# Patient Record
Sex: Female | Born: 1955 | Hispanic: No | Smoking: Former smoker
Health system: Southern US, Community
[De-identification: ages and names within clinical notes are randomized; demographics above are authoritative.]

## PROBLEM LIST (undated history)

## (undated) DIAGNOSIS — Z6836 Body mass index (BMI) 36.0-36.9, adult: Secondary | ICD-10-CM

## (undated) DIAGNOSIS — M2550 Pain in unspecified joint: Secondary | ICD-10-CM

## (undated) DIAGNOSIS — I10 Essential (primary) hypertension: Secondary | ICD-10-CM

## (undated) DIAGNOSIS — J45909 Unspecified asthma, uncomplicated: Secondary | ICD-10-CM

## (undated) HISTORY — PX: APPENDECTOMY: SHX54

## (undated) HISTORY — DX: Unspecified asthma, uncomplicated: J45.909

## (undated) HISTORY — DX: Essential (primary) hypertension: I10

## (undated) HISTORY — DX: Pain in unspecified joint: M25.50

## (undated) HISTORY — DX: Body mass index (BMI) 36.0-36.9, adult: Z68.36

---

## 2009-09-27 ENCOUNTER — Other Ambulatory Visit: Admission: RE | Admit: 2009-09-27 | Discharge: 2009-09-27 | Payer: Self-pay | Admitting: Family Medicine

## 2010-03-27 ENCOUNTER — Emergency Department (HOSPITAL_COMMUNITY): Admission: EM | Admit: 2010-03-27 | Discharge: 2010-03-27 | Payer: Self-pay | Admitting: Emergency Medicine

## 2010-04-05 ENCOUNTER — Ambulatory Visit: Payer: Self-pay | Admitting: Family Medicine

## 2010-04-05 LAB — CONVERTED CEMR LAB
AST: 26 units/L (ref 0–37)
Albumin: 4 g/dL (ref 3.5–5.2)
Alkaline Phosphatase: 76 units/L (ref 39–117)
BUN: 25 mg/dL — ABNORMAL HIGH (ref 6–23)
CO2: 22 meq/L (ref 19–32)
Calcium: 9.1 mg/dL (ref 8.4–10.5)
Chloride: 107 meq/L (ref 96–112)
Glucose, Bld: 81 mg/dL (ref 70–99)
Potassium: 4.3 meq/L (ref 3.5–5.3)
TSH: 2.56 microintl units/mL (ref 0.350–4.500)
Total Bilirubin: 0.4 mg/dL (ref 0.3–1.2)

## 2010-04-11 ENCOUNTER — Encounter (INDEPENDENT_AMBULATORY_CARE_PROVIDER_SITE_OTHER): Payer: Self-pay | Admitting: Family Medicine

## 2010-04-11 LAB — CONVERTED CEMR LAB
HCV Ab: NEGATIVE
Hep B Core Total Ab: NEGATIVE
Hep B S Ab: NEGATIVE

## 2010-04-19 ENCOUNTER — Ambulatory Visit (HOSPITAL_COMMUNITY): Admission: RE | Admit: 2010-04-19 | Discharge: 2010-04-19 | Payer: Self-pay | Admitting: Family Medicine

## 2010-04-27 ENCOUNTER — Ambulatory Visit: Payer: Self-pay | Admitting: Internal Medicine

## 2010-06-06 ENCOUNTER — Encounter (INDEPENDENT_AMBULATORY_CARE_PROVIDER_SITE_OTHER): Payer: Self-pay | Admitting: Family Medicine

## 2010-06-06 ENCOUNTER — Ambulatory Visit: Payer: Self-pay | Admitting: Internal Medicine

## 2010-06-06 LAB — CONVERTED CEMR LAB
Basophils Absolute: 0 10*3/uL (ref 0.0–0.1)
Eosinophils Relative: 2 % (ref 0–5)
HCT: 41.7 % (ref 36.0–46.0)
Lymphocytes Relative: 26 % (ref 12–46)
Lymphs Abs: 2.9 10*3/uL (ref 0.7–4.0)
MCHC: 31.7 g/dL (ref 30.0–36.0)
MCV: 95.4 fL (ref 78.0–100.0)
Neutrophils Relative %: 66 % (ref 43–77)
Prolactin: 14.6 ng/mL
WBC: 11 10*3/uL — ABNORMAL HIGH (ref 4.0–10.5)

## 2010-06-09 ENCOUNTER — Ambulatory Visit (HOSPITAL_COMMUNITY): Admission: RE | Admit: 2010-06-09 | Discharge: 2010-06-09 | Payer: Self-pay | Admitting: Internal Medicine

## 2010-07-25 ENCOUNTER — Ambulatory Visit (HOSPITAL_COMMUNITY): Admission: RE | Admit: 2010-07-25 | Discharge: 2010-07-25 | Payer: Self-pay | Admitting: Internal Medicine

## 2010-09-08 ENCOUNTER — Ambulatory Visit: Payer: Self-pay | Admitting: Obstetrics and Gynecology

## 2010-09-08 ENCOUNTER — Other Ambulatory Visit
Admission: RE | Admit: 2010-09-08 | Discharge: 2010-09-08 | Payer: Self-pay | Source: Home / Self Care | Admitting: Obstetrics and Gynecology

## 2010-09-08 LAB — CONVERTED CEMR LAB: LH: 24.2 milliintl units/mL

## 2010-10-14 ENCOUNTER — Ambulatory Visit (HOSPITAL_COMMUNITY): Admission: RE | Admit: 2010-10-14 | Payer: Self-pay | Source: Home / Self Care | Admitting: Family Medicine

## 2010-11-20 ENCOUNTER — Encounter: Payer: Self-pay | Admitting: *Deleted

## 2011-01-16 LAB — POCT I-STAT, CHEM 8
Calcium, Ion: 1.14 mmol/L (ref 1.12–1.32)
Glucose, Bld: 91 mg/dL (ref 70–99)
HCT: 38 % (ref 36.0–46.0)
Hemoglobin: 12.9 g/dL (ref 12.0–15.0)

## 2011-07-30 ENCOUNTER — Other Ambulatory Visit: Payer: Self-pay | Admitting: Obstetrics and Gynecology

## 2011-07-30 DIAGNOSIS — Z1231 Encounter for screening mammogram for malignant neoplasm of breast: Secondary | ICD-10-CM

## 2011-08-01 ENCOUNTER — Ambulatory Visit (INDEPENDENT_AMBULATORY_CARE_PROVIDER_SITE_OTHER): Payer: Self-pay | Admitting: *Deleted

## 2011-08-01 ENCOUNTER — Ambulatory Visit (HOSPITAL_COMMUNITY): Payer: Self-pay | Attending: Obstetrics and Gynecology

## 2011-08-01 ENCOUNTER — Inpatient Hospital Stay (INDEPENDENT_AMBULATORY_CARE_PROVIDER_SITE_OTHER)
Admission: RE | Admit: 2011-08-01 | Discharge: 2011-08-01 | Disposition: A | Discharge status: Home / Self Care | Payer: Self-pay | Source: Ambulatory Visit | Attending: Family Medicine | Admitting: Family Medicine

## 2011-08-01 ENCOUNTER — Encounter: Payer: Self-pay | Admitting: *Deleted

## 2011-08-01 VITALS — BP 138/100 | HR 106 | Temp 97.1°F | Resp 18

## 2011-08-01 DIAGNOSIS — R51 Headache: Secondary | ICD-10-CM

## 2011-08-01 DIAGNOSIS — I10 Essential (primary) hypertension: Secondary | ICD-10-CM

## 2011-08-01 LAB — POCT I-STAT, CHEM 8
BUN: 27 mg/dL — ABNORMAL HIGH (ref 6–23)
Calcium, Ion: 1.28 mmol/L (ref 1.12–1.32)
HCT: 44 % (ref 36.0–46.0)
TCO2: 24 mmol/L (ref 0–100)

## 2011-08-01 NOTE — Progress Notes (Signed)
Pt in for [facility]CP clinic.  When called to back, pt states "I don't feel good, my blood pressure is up."  Complains of lightheadedness and blurred vision x 4 days.  Also states that she has been 'peeing' a lot since last night.  Pt states that she was on blood pressure meds but was taken off of them 5 months ago by her doctor.  She is currently on no medication (per patient).  Pt is alert&ox3, skin is warm and dry, denies pain. States "I am just blurry".   After discussion with BCCCP team, we will call family to pick patient up and drive her to an urgent care facility.  [facility], [facility] or [facility]).  Patient and interpreter verbalizes understanding of today's issues.  Patient will be rescheduled for the [facility] clinic on a future date.

## 2011-08-15 ENCOUNTER — Ambulatory Visit (INDEPENDENT_AMBULATORY_CARE_PROVIDER_SITE_OTHER): Payer: Self-pay | Admitting: *Deleted

## 2011-08-15 ENCOUNTER — Encounter (HOSPITAL_COMMUNITY): Payer: Self-pay

## 2011-08-15 ENCOUNTER — Ambulatory Visit (HOSPITAL_COMMUNITY)
Admission: RE | Admit: 2011-08-15 | Discharge: 2011-08-15 | Disposition: A | Discharge status: Home / Self Care | Payer: Self-pay | Source: Ambulatory Visit | Attending: Obstetrics and Gynecology | Admitting: Obstetrics and Gynecology

## 2011-08-15 VITALS — BP 138/87 | HR 94 | Temp 98.0°F | Resp 18 | Ht 60.0 in | Wt 184.8 lb

## 2011-08-15 DIAGNOSIS — Z01419 Encounter for gynecological examination (general) (routine) without abnormal findings: Secondary | ICD-10-CM

## 2011-08-15 DIAGNOSIS — Z1231 Encounter for screening mammogram for malignant neoplasm of breast: Secondary | ICD-10-CM

## 2011-08-15 NOTE — Patient Instructions (Signed)
Taught patient how to do BSE and gave patient education materials. Patient sent upstairs with escort to get mammogram. Told patient if Pap smear is normal will not need a Pap smear for 3 years per BCCCP guidelines. Patient verbalized understanding.

## 2011-08-15 NOTE — Progress Notes (Signed)
No complaints today.  Pap Smear:    Pap smear completed today. Per patient was not sure of last Pap smear. In [facility] patient had a Pap smear 09/08/2010. Pap smear result was normal. Told patient if this Pap smear is normal will not need another Pap smear for 3 years per BCCCP guidelines.  Physical exam: Breasts      Breasts symmetrical. No skin abnormalities. No nipple retraction and no nipple discharge bilateral breasts. No lymphadenopathy. No lumps bilateral breasts.      Pelvic/Bimanual   Ext Genitalia      No lesions and no swelling. No discharge observed.    Vagina      Vagina pink. Vagina of normal texture and no lesions observed. Bloody discharge observed in vaginal area.     Cervix      Cervix present. Cervix friable and red around os. Cervix normal texture.     Uterus      Uterus present and palpable. Uterus normal position and normal size.     Adnexae      Ovaries present and not palpable. No tenderness on palpation.         Rectovaginal      No rectal exam completed today. No abnormalities observed. No complaints of rectal pain or any concerns.

## 2011-08-16 ENCOUNTER — Other Ambulatory Visit: Payer: Self-pay | Admitting: Obstetrics and Gynecology

## 2011-08-16 DIAGNOSIS — R928 Other abnormal and inconclusive findings on diagnostic imaging of breast: Secondary | ICD-10-CM

## 2011-09-06 ENCOUNTER — Ambulatory Visit
Admission: RE | Admit: 2011-09-06 | Discharge: 2011-09-06 | Disposition: A | Discharge status: Home / Self Care | Payer: No Typology Code available for payment source | Source: Ambulatory Visit | Attending: Obstetrics and Gynecology | Admitting: Obstetrics and Gynecology

## 2011-09-06 DIAGNOSIS — R928 Other abnormal and inconclusive findings on diagnostic imaging of breast: Secondary | ICD-10-CM

## 2011-09-08 ENCOUNTER — Telehealth: Payer: Self-pay | Admitting: *Deleted

## 2011-09-08 NOTE — Telephone Encounter (Signed)
Spoke with patient using language line interpreter # [phone] Mary Hubbard. Pt notified of normal pap smear, and that diagnostic mammogram was normal. Pt advised that she needs to have yearly screenings done. Pt voiced understanding and will follow up as planned.

## 2011-09-12 ENCOUNTER — Encounter: Payer: Self-pay | Admitting: Obstetrics and Gynecology

## 2014-03-04 ENCOUNTER — Ambulatory Visit: Payer: Self-pay

## 2014-04-20 ENCOUNTER — Ambulatory Visit: Payer: No Typology Code available for payment source | Attending: Internal Medicine | Admitting: Internal Medicine

## 2014-04-20 ENCOUNTER — Encounter: Payer: Self-pay | Admitting: Internal Medicine

## 2014-04-20 VITALS — BP 152/99 | HR 84 | Temp 97.9°F | Resp 18 | Ht 62.5 in | Wt 195.8 lb

## 2014-04-20 DIAGNOSIS — Z0000 Encounter for general adult medical examination without abnormal findings: Secondary | ICD-10-CM

## 2014-04-20 DIAGNOSIS — J45909 Unspecified asthma, uncomplicated: Secondary | ICD-10-CM

## 2014-04-20 DIAGNOSIS — I10 Essential (primary) hypertension: Secondary | ICD-10-CM

## 2014-04-20 LAB — POCT GLYCOSYLATED HEMOGLOBIN (HGB A1C): HEMOGLOBIN A1C: 4.9

## 2014-04-20 MED ORDER — ALBUTEROL SULFATE HFA 108 (90 BASE) MCG/ACT IN AERS: 2.0000 | INHALATION_SPRAY | Freq: Four times a day (QID) | RESPIRATORY_TRACT | Status: DC | PRN

## 2014-04-20 MED ORDER — ALBUTEROL SULFATE (2.5 MG/3ML) 0.083% IN NEBU: 2.5000 mg | INHALATION_SOLUTION | Freq: Four times a day (QID) | RESPIRATORY_TRACT | Status: DC | PRN

## 2014-04-20 MED ORDER — AMLODIPINE BESYLATE 2.5 MG PO TABS: 2.5000 mg | ORAL_TABLET | Freq: Every day | ORAL | Status: DC

## 2014-04-20 MED ORDER — PREDNISONE 50 MG PO TABS: 10.0000 mg | ORAL_TABLET | Freq: Every day | ORAL | Status: DC

## 2014-04-20 NOTE — Addendum Note (Signed)
Addended by: Robbie Lis on: 04/20/2014 09:32 AM   Modules accepted: Orders

## 2014-04-20 NOTE — Patient Instructions (Signed)
Hypertension Hypertension, commonly called high blood pressure, is when the force of blood pumping through your arteries is too strong. Your arteries are the blood vessels that carry blood from your heart throughout your body. A blood pressure reading consists of a higher number over a lower number, such as 110/72. The higher number (systolic) is the pressure inside your arteries when your heart pumps. The lower number (diastolic) is the pressure inside your arteries when your heart relaxes. Ideally you want your blood pressure below 120/80. Hypertension forces your heart to work harder to pump blood. Your arteries may become narrow or stiff. Having hypertension puts you at risk for heart disease, stroke, and other problems.  RISK FACTORS Some risk factors for high blood pressure are controllable. Others are not.  Risk factors you cannot control include:   Race. You may be at higher risk if you are African American.  Age. Risk increases with age.  Gender. Men are at higher risk than women before age 45 years. After age 65, women are at higher risk than men. Risk factors you can control include:  Not getting enough exercise or physical activity.  Being overweight.  Getting too much fat, sugar, calories, or salt in your diet.  Drinking too much alcohol. SIGNS AND SYMPTOMS Hypertension does not usually cause signs or symptoms. Extremely high blood pressure (hypertensive crisis) may cause headache, anxiety, shortness of breath, and nosebleed. DIAGNOSIS  To check if you have hypertension, your health care provider will measure your blood pressure while you are seated, with your arm held at the level of your heart. It should be measured at least twice using the same arm. Certain conditions can cause a difference in blood pressure between your right and left arms. A blood pressure reading that is higher than normal on one occasion does not mean that you need treatment. If one blood pressure reading  is high, ask your health care provider about having it checked again. TREATMENT  Treating high blood pressure includes making lifestyle changes and possibly taking medication. Living a healthy lifestyle can help lower high blood pressure. You may need to change some of your habits. Lifestyle changes may include:  Following the DASH diet. This diet is high in fruits, vegetables, and whole grains. It is low in salt, red meat, and added sugars.  Getting at least 2 1/2 hours of brisk physical activity every week.  Losing weight if necessary.  Not smoking.  Limiting alcoholic beverages.  Learning ways to reduce stress. If lifestyle changes are not enough to get your blood pressure under control, your health care provider may prescribe medicine. You may need to take more than one. Work closely with your health care provider to understand the risks and benefits. HOME CARE INSTRUCTIONS  Have your blood pressure rechecked as directed by your health care provider.   Only take medicine as directed by your health care provider. Follow the directions carefully. Blood pressure medicines must be taken as prescribed. The medicine does not work as well when you skip doses. Skipping doses also puts you at risk for problems.   Do not smoke.   Monitor your blood pressure at home as directed by your health care provider. SEEK MEDICAL CARE IF:   You think you are having a reaction to medicines taken.  You have recurrent headaches or feel dizzy.  You have swelling in your ankles.  You have trouble with your vision. SEEK IMMEDIATE MEDICAL CARE IF:  You develop a severe headache or   confusion.  You have unusual weakness, numbness, or feel faint.  You have severe chest or abdominal pain.  You vomit repeatedly.  You have trouble breathing. MAKE SURE YOU:   Understand these instructions.  Will watch your condition.  Will get help right away if you are not doing well or get  worse. Document Released: 10/16/2005 Document Revised: 10/21/2013 Document Reviewed: 08/08/2013 ExitCare Patient Information 2015 [employer]. This information is not intended to replace advice given to you by your health care provider. Make sure you discuss any questions you have with your health care provider.  

## 2014-04-20 NOTE — Progress Notes (Signed)
Patient ID: Mary Hubbard, female   DOB: [birth year], 58 y.o.   MRN: [id number]  CC: New patient  HPI: 58 year old female with past medical history of asthma and hypertension but is not on any blood pressure medication presented to clinic to establish primary care physician. Patient reports feeling short of breath for past one to 2 weeks with some wheezing. She does have cough although inconsistent dry and sometimes productive. No fevers or chills. No chest tightness. No blurry vision or headaches.  No Known Allergies Past Medical History  Diagnosis Date  . Hypertension   . Joint pain   . Asthma    No current outpatient prescriptions on file prior to visit.   No current facility-administered medications on file prior to visit.   No family history on file. History   Social History  . Marital Status: Widowed    Spouse Name: N/A    Number of Children: N/A  . Years of Education: N/A   Occupational History  . unemployed [employer] History Main Topics  . Smoking status: Never Smoker   . Smokeless tobacco: Never Used  . Alcohol Use: No  . Drug Use: No  . Sexual Activity: No   Other Topics Concern  . Not on file   Social History Narrative  . No narrative on file    Review of Systems  Constitutional: Negative for fever, chills, diaphoresis, activity change, appetite change and fatigue.  HENT: Negative for ear pain, nosebleeds, congestion, facial swelling, rhinorrhea, neck pain, neck stiffness and ear discharge.   Eyes: Negative for pain, discharge, redness, itching and visual disturbance.  Respiratory: Negative for cough, choking, chest tightness, shortness of breath, wheezing and stridor.   Cardiovascular: Negative for chest pain, palpitations and leg swelling.  Gastrointestinal: Negative for abdominal distention.  Genitourinary: Negative for dysuria, urgency, frequency, hematuria, flank pain, decreased urine volume, difficulty urinating and dyspareunia.  Musculoskeletal:  Negative for back pain, joint swelling, arthralgias and gait problem.  Neurological: Negative for dizziness, tremors, seizures, syncope, facial asymmetry, speech difficulty, weakness, light-headedness, numbness and headaches.  Hematological: Negative for adenopathy. Does not bruise/bleed easily.  Psychiatric/Behavioral: Negative for hallucinations, behavioral problems, confusion, dysphoric mood, decreased concentration and agitation.    Objective:   Filed Vitals:   04/20/14 0913  BP: 152/99  Pulse: 84  Temp: 97.9 F (36.6 C)  Resp: 18    Physical Exam  Constitutional: Appears well-developed and well-nourished. No distress.  HENT: Normocephalic. External right and left ear normal. Oropharynx is clear and moist.  Eyes: Conjunctivae and EOM are normal. PERRLA, no scleral icterus.  Neck: Normal ROM. Neck supple. No JVD. No tracheal deviation. No thyromegaly.  CVS: RRR, S1/S2 +, no murmurs, no gallops, no carotid bruit.  Pulmonary: Wheezing in upper and mid lung lobes, no crackles.  Abdominal: Soft. BS +,  no distension, tenderness, rebound or guarding.  Musculoskeletal: Normal range of motion. No edema and no tenderness.  Lymphadenopathy: No lymphadenopathy noted, cervical, inguinal. Neuro: Alert. Normal reflexes, muscle tone coordination. No cranial nerve deficit. Skin: Skin is warm and dry. No rash noted. Not diaphoretic. No erythema. No pallor.  Psychiatric: Normal mood and affect. Behavior, judgment, thought content normal.   Lab Results  Component Value Date   WBC 11.0* 06/06/2010   HGB 15.0 08/01/2011   HCT 44.0 08/01/2011   MCV 95.4 06/06/2010   PLT 209 06/06/2010   Lab Results  Component Value Date   CREATININE 0.80 08/01/2011   BUN 27* 08/01/2011   NA  138 08/01/2011   K 4.7 08/01/2011   CL 105 08/01/2011   CO2 22 04/05/2010    No results found for this basename: HGBA1C   Lipid Panel  No results found for this basename: chol, trig, hdl, cholhdl, vldl, ldlcalc        Assessment and plan:   Patient Active Problem List   Diagnosis Date Noted  . Asthma due to seasonal allergies 04/20/2014    Priority: Medium - Prescription provided for albuterol inhaler as needed. Patient will take prednisone 50 mg for 7 days daily. She was instructed to come back to see [place] in one week after she finishes prednisone course to make sure her respiratory status remains stable . - Referral to pulmonary provided   . HTN (hypertension) 04/20/2014    Priority: Medium - Start low-dose Norvasc 2.5 mg daily. Recheck blood pressure during next visit in one week  - We have discussed target BP range - I have advised pt to check BP regularly and to call us back if the numbers are higher than 140/90 - discussed the importance of compliance with medical therapy and diet   . Preventative health care 04/20/2014    Priority: Medium - Check A1c

## 2014-04-20 NOTE — Addendum Note (Signed)
Addended by: Robbie Lis on: 04/20/2014 09:37 AM   Modules accepted: Orders, Medications

## 2014-04-20 NOTE — Progress Notes (Signed)
Patient presents to establish care for Asthma and HTN CC: dizziness and cough

## 2014-04-29 ENCOUNTER — Encounter: Payer: Self-pay | Admitting: Internal Medicine

## 2014-04-29 ENCOUNTER — Ambulatory Visit: Payer: No Typology Code available for payment source | Attending: Internal Medicine | Admitting: Internal Medicine

## 2014-04-29 ENCOUNTER — Other Ambulatory Visit: Payer: Self-pay | Admitting: Internal Medicine

## 2014-04-29 ENCOUNTER — Ambulatory Visit: Payer: No Typology Code available for payment source | Attending: Internal Medicine

## 2014-04-29 VITALS — BP 130/87 | HR 70 | Temp 98.0°F | Resp 14 | Ht 62.0 in | Wt 195.0 lb

## 2014-04-29 DIAGNOSIS — M2550 Pain in unspecified joint: Secondary | ICD-10-CM | POA: Insufficient documentation

## 2014-04-29 DIAGNOSIS — J45909 Unspecified asthma, uncomplicated: Secondary | ICD-10-CM | POA: Insufficient documentation

## 2014-04-29 DIAGNOSIS — I10 Essential (primary) hypertension: Secondary | ICD-10-CM | POA: Insufficient documentation

## 2014-04-29 MED ORDER — ALBUTEROL SULFATE HFA 108 (90 BASE) MCG/ACT IN AERS: 2.0000 | INHALATION_SPRAY | Freq: Four times a day (QID) | RESPIRATORY_TRACT | Status: DC | PRN

## 2014-04-29 NOTE — Patient Instructions (Signed)
DASH Eating Plan  DASH stands for "Dietary Approaches to Stop Hypertension." The DASH eating plan is a healthy eating plan that has been shown to reduce high blood pressure (hypertension). Additional health benefits may include reducing the risk of type 2 diabetes mellitus, heart disease, and stroke. The DASH eating plan may also help with weight loss.  WHAT DO I NEED TO KNOW ABOUT THE DASH EATING PLAN?  For the DASH eating plan, you will follow these general guidelines:  · Choose foods with a percent daily value for sodium of less than 5% (as listed on the food label).  · Use salt-free seasonings or herbs instead of table salt or sea salt.  · Check with your health care provider or pharmacist before using salt substitutes.  · Eat lower-sodium products, often labeled as "lower sodium" or "no salt added."  · Eat fresh foods.  · Eat more vegetables, fruits, and low-fat dairy products.  · Choose whole grains. Look for the word "whole" as the first word in the ingredient list.  · Choose fish and skinless chicken or turkey more often than red meat. Limit fish, poultry, and meat to 6 oz (170 g) each day.  · Limit sweets, desserts, sugars, and sugary drinks.  · Choose heart-healthy fats.  · Limit cheese to 1 oz (28 g) per day.  · Eat more home-cooked food and less restaurant, buffet, and fast food.  · Limit fried foods.  · Cook foods using methods other than frying.  · Limit canned vegetables. If you do use them, rinse them well to decrease the sodium.  · When eating at a restaurant, ask that your food be prepared with less salt, or no salt if possible.  WHAT FOODS CAN I EAT?  Seek help from a dietitian for individual calorie needs.  Grains  Whole grain or whole wheat bread. Brown rice. Whole grain or whole wheat pasta. Quinoa, bulgur, and whole grain cereals. Low-sodium cereals. Corn or whole wheat flour tortillas. Whole grain cornbread. Whole grain crackers. Low-sodium crackers.  Vegetables  Fresh or frozen vegetables  (raw, steamed, roasted, or grilled). Low-sodium or reduced-sodium tomato and vegetable juices. Low-sodium or reduced-sodium tomato sauce and paste. Low-sodium or reduced-sodium canned vegetables.   Fruits  All fresh, canned (in natural juice), or frozen fruits.  Meat and Other Protein Products  Ground beef (85% or leaner), grass-fed beef, or beef trimmed of fat. Skinless chicken or turkey. Ground chicken or turkey. Pork trimmed of fat. All fish and seafood. Eggs. Dried beans, peas, or lentils. Unsalted nuts and seeds. Unsalted canned beans.  Dairy  Low-fat dairy products, such as skim or 1% milk, 2% or reduced-fat cheeses, low-fat ricotta or cottage cheese, or plain low-fat yogurt. Low-sodium or reduced-sodium cheeses.  Fats and Oils  Tub margarines without trans fats. Light or reduced-fat mayonnaise and salad dressings (reduced sodium). Avocado. Safflower, olive, or canola oils. Natural peanut or almond butter.  Other  Unsalted popcorn and pretzels.  The items listed above may not be a complete list of recommended foods or beverages. Contact your dietitian for more options.  WHAT FOODS ARE NOT RECOMMENDED?  Grains  White bread. White pasta. White rice. Refined cornbread. Bagels and croissants. Crackers that contain trans fat.  Vegetables  Creamed or fried vegetables. Vegetables in a cheese sauce. Regular canned vegetables. Regular canned tomato sauce and paste. Regular tomato and vegetable juices.  Fruits  Dried fruits. Canned fruit in light or heavy syrup. Fruit juice.  Meat and Other Protein   Products  Fatty cuts of meat. Ribs, chicken wings, bacon, sausage, bologna, salami, chitterlings, fatback, hot dogs, bratwurst, and packaged luncheon meats. Salted nuts and seeds. Canned beans with salt.  Dairy  Whole or 2% milk, cream, half-and-half, and cream cheese. Whole-fat or sweetened yogurt. Full-fat cheeses or blue cheese. Nondairy creamers and whipped toppings. Processed cheese, cheese spreads, or cheese  curds.  Condiments  Onion and garlic salt, seasoned salt, table salt, and sea salt. Canned and packaged gravies. Worcestershire sauce. Tartar sauce. Barbecue sauce. Teriyaki sauce. Soy sauce, including reduced sodium. Steak sauce. Fish sauce. Oyster sauce. Cocktail sauce. Horseradish. Ketchup and mustard. Meat flavorings and tenderizers. Bouillon cubes. Hot sauce. Tabasco sauce. Marinades. Taco seasonings. Relishes.  Fats and Oils  Butter, stick margarine, lard, shortening, ghee, and bacon fat. Coconut, palm kernel, or palm oils. Regular salad dressings.  Other  Pickles and olives. Salted popcorn and pretzels.  The items listed above may not be a complete list of foods and beverages to avoid. Contact your dietitian for more information.  WHERE CAN I FIND MORE INFORMATION?  National Heart, Lung, and Blood Institute: www.nhlbi.nih.gov/health/health-topics/topics/dash/  Document Released: 10/05/2011 Document Revised: 10/21/2013 Document Reviewed: 08/20/2013  ExitCare® Patient Information ©2015 ExitCare, LLC. This information is not intended to replace advice given to you by your health care provider. Make sure you discuss any questions you have with your health care provider.

## 2014-04-29 NOTE — Progress Notes (Signed)
Patient ID: Mary Hubbard, female   DOB: 1956-01-31, 58 y.o.   MRN: [id number]  CC: HTN, Asthma  HPI: Patient reports compliance with medication since discharge from ER.  Some occasional night cough once weekly. Patient reports improvement in SOB and cough since using prednisone taper.  Reports that she has only used her inhaler 3 times in the past few days.   No Known Allergies Past Medical History  Diagnosis Date  . Hypertension   . Joint pain   . Asthma    Current Outpatient Prescriptions on File Prior to Visit  Medication Sig Dispense Refill  . albuterol (PROVENTIL HFA;VENTOLIN HFA) 108 (90 BASE) MCG/ACT inhaler Inhale 2 puffs into the lungs every 6 (six) hours as needed for wheezing or shortness of breath.  1 Inhaler  3  . amLODipine (NORVASC) 2.5 MG tablet Take 1 tablet (2.5 mg total) by mouth daily.  90 tablet  3  . predniSONE (DELTASONE) 50 MG tablet Take 0.5 tablets (25 mg total) by mouth daily with breakfast.  7 tablet  0   No current facility-administered medications on file prior to visit.   History reviewed. No pertinent family history. History   Social History  . Marital Status: Widowed    Spouse Name: N/A    Number of Children: N/A  . Years of Education: N/A   Occupational History  . unemployed [employer] History Main Topics  . Smoking status: Never Smoker   . Smokeless tobacco: Never Used  . Alcohol Use: No  . Drug Use: No  . Sexual Activity: No   Other Topics Concern  . Not on file   Social History Narrative  . No narrative on file   Review of Systems  Constitutional: Negative.   HENT: Negative.   Eyes: Negative for blurred vision and double vision.  Respiratory: Positive for cough and shortness of breath (occasional).   Cardiovascular: Negative.   Neurological: Positive for dizziness.     Objective:   Filed Vitals:   04/29/14 1130  BP: 130/87  Pulse: 70  Temp: 98 F (36.7 C)  Resp: 14    Physical Exam: Constitutional: Patient  appears well-developed and well-nourished. No distress. HENT: Normocephalic, atraumatic, External right and left ear normal. Oropharynx is clear and moist.  Eyes: Conjunctivae and EOM are normal. PERRLA, no scleral icterus. Neck: Normal ROM. Neck supple. No JVD. No tracheal deviation. No thyromegaly. CVS: RRR, S1/S2 +, no murmurs, no gallops, no carotid bruit.  Pulmonary: Effort and breath sounds normal, no stridor, rhonchi, wheezes, rales.  Abdominal: Soft. BS +,  no distension, tenderness, rebound or guarding.  Musculoskeletal: Normal range of motion. No edema and no tenderness.  Lymphadenopathy: No lymphadenopathy noted, cervical, Neuro: Alert. Normal reflexes Skin: Skin is warm and dry. No rash noted. Not diaphoretic. No erythema. No pallor. Psychiatric: Normal mood and affect. Behavior, judgment, thought content normal.  Lab Results  Component Value Date   WBC 11.0* 06/06/2010   HGB 15.0 08/01/2011   HCT 44.0 08/01/2011   MCV 95.4 06/06/2010   PLT 209 06/06/2010   Lab Results  Component Value Date   CREATININE 0.80 08/01/2011   BUN 27* 08/01/2011   NA 138 08/01/2011   K 4.7 08/01/2011   CL 105 08/01/2011   CO2 22 04/05/2010    Lab Results  Component Value Date   HGBA1C 4.9 04/20/2014   Lipid Panel  No results found for this basename: chol, trig, hdl, cholhdl, vldl, ldlcalc  Assessment and plan:   Puneet was seen today for follow-up.  Diagnoses and associated orders for this visit:  Essential hypertension Continue amlodipine 2.5 mg daily Asthma due to seasonal allergies Continue Ventolin as needed for shortness of breath, cough  Return in about 3 months (around 07/30/2014) for Hypertension.       Chari Manning, NP-C [facility] [phone] 05/05/2014, 12:48 PM

## 2014-04-29 NOTE — Progress Notes (Signed)
Pt is here following up on her asthma and HTN. Pt has no C.C. today Pt has an interpreter.

## 2014-06-09 ENCOUNTER — Institutional Professional Consult (permissible substitution): Payer: No Typology Code available for payment source | Admitting: Internal Medicine

## 2014-06-25 ENCOUNTER — Other Ambulatory Visit: Payer: Self-pay

## 2014-06-25 ENCOUNTER — Telehealth: Payer: Self-pay | Admitting: Internal Medicine

## 2014-06-25 NOTE — Telephone Encounter (Signed)
Patient has come in today  Requesting a refill for albuterol (PROVENTIL HFA;VENTOLIN HFA) 108 (90 BASE) MCG/ACT inhaler; please f/u with patient

## 2014-07-30 ENCOUNTER — Ambulatory Visit: Payer: No Typology Code available for payment source | Admitting: Internal Medicine

## 2014-08-03 ENCOUNTER — Ambulatory Visit: Payer: No Typology Code available for payment source | Attending: Internal Medicine | Admitting: Internal Medicine

## 2014-08-03 ENCOUNTER — Encounter: Payer: Self-pay | Admitting: Internal Medicine

## 2014-08-03 VITALS — BP 155/104 | HR 95 | Temp 99.1°F | Resp 18 | Ht 61.0 in | Wt 193.0 lb

## 2014-08-03 DIAGNOSIS — M2550 Pain in unspecified joint: Secondary | ICD-10-CM | POA: Insufficient documentation

## 2014-08-03 DIAGNOSIS — I10 Essential (primary) hypertension: Secondary | ICD-10-CM | POA: Insufficient documentation

## 2014-08-03 DIAGNOSIS — Z0000 Encounter for general adult medical examination without abnormal findings: Secondary | ICD-10-CM

## 2014-08-03 DIAGNOSIS — J45909 Unspecified asthma, uncomplicated: Secondary | ICD-10-CM | POA: Insufficient documentation

## 2014-08-03 DIAGNOSIS — J302 Other seasonal allergic rhinitis: Secondary | ICD-10-CM

## 2014-08-03 DIAGNOSIS — Z79899 Other long term (current) drug therapy: Secondary | ICD-10-CM | POA: Insufficient documentation

## 2014-08-03 MED ORDER — LORATADINE 10 MG PO TABS: 10.0000 mg | ORAL_TABLET | Freq: Every day | ORAL | Status: DC

## 2014-08-03 MED ORDER — AMLODIPINE BESYLATE 5 MG PO TABS: 5.0000 mg | ORAL_TABLET | Freq: Every day | ORAL | Status: DC

## 2014-08-03 NOTE — Progress Notes (Signed)
Pt here to f/u with HTN,Asthma  Pt has not been taking blood pressure medication since 2 mnths ago Denies headache,blurry vision or dizziness C/o sob x 5 years, using albuterol inhaler as needed BP- 155/104 95 Need Colonoscopy  Santiago Glad interpretor present

## 2014-08-03 NOTE — Progress Notes (Signed)
Patient ID: Mary Hubbard, female   DOB: 1956/01/25, 58 y.o.   MRN: [id number]  CC: HTN  HPI:  Patient presents to clinic today for a follow up of hypertension.  Patient reports that she has not taken her blood pressure medication in two months because she ran out of medication and did not know she was suppose to get refills.  She denies chest pain, leg swelling, blurred vision, or headaches.  She reports that she is a vegetarian and does not eat much salt.  She c/o SOB with exercise.  She states that she notices the SOB more after doing more activity.  She has been using the albuterol inhaler 3-4 times per week.  She denies night time coughing or SOB.    No Known Allergies Past Medical History  Diagnosis Date  . Hypertension   . Joint pain   . Asthma    Current Outpatient Prescriptions on File Prior to Visit  Medication Sig Dispense Refill  . albuterol (PROVENTIL HFA;VENTOLIN HFA) 108 (90 BASE) MCG/ACT inhaler Inhale 2 puffs into the lungs every 6 (six) hours as needed for wheezing or shortness of breath.  3 Inhaler  3  . amLODipine (NORVASC) 2.5 MG tablet Take 1 tablet (2.5 mg total) by mouth daily.  90 tablet  3  . predniSONE (DELTASONE) 50 MG tablet Take 0.5 tablets (25 mg total) by mouth daily with breakfast.  7 tablet  0   No current facility-administered medications on file prior to visit.   No family history on file. History   Social History  . Marital Status: Widowed    Spouse Name: N/A    Number of Children: N/A  . Years of Education: N/A   Occupational History  . unemployed [place] History Main Topics  . Smoking status: Never Smoker   . Smokeless tobacco: Never Used  . Alcohol Use: No  . Drug Use: No  . Sexual Activity: No   Other Topics Concern  . Not on file   Social History Narrative  . No narrative on file    Review of Systems: See HPI   Objective:   Filed Vitals:   08/03/14 1556  BP: 155/104  Pulse: 95  Temp: 99.1 F (37.3 C)  Resp: 18     Physical Exam  Constitutional: She is oriented to person, place, and time.  Cardiovascular: Normal rate, regular rhythm and normal heart sounds.   No murmur heard. Pulmonary/Chest: Effort normal and breath sounds normal. She has no wheezes.  Abdominal: Soft. Bowel sounds are normal.  Musculoskeletal: Normal range of motion. She exhibits no edema and no tenderness.  Neurological: She is alert and oriented to person, place, and time.  Psychiatric: She has a normal mood and affect.     Lab Results  Component Value Date   WBC 11.0* 06/06/2010   HGB 15.0 08/01/2011   HCT 44.0 08/01/2011   MCV 95.4 06/06/2010   PLT 209 06/06/2010   Lab Results  Component Value Date   CREATININE 0.80 08/01/2011   BUN 27* 08/01/2011   NA 138 08/01/2011   K 4.7 08/01/2011   CL 105 08/01/2011   CO2 22 04/05/2010    Lab Results  Component Value Date   HGBA1C 4.9 04/20/2014   Lipid Panel  No results found for this basename: chol, trig, hdl, cholhdl, vldl, ldlcalc       Assessment and plan:   Mary Hubbard was seen today for follow-up, hypertension and asthma.  Diagnoses and associated  orders for this visit:  Preventative health care - HM COLONOSCOPY - MM Digital Screening; Future  Seasonal allergies - loratadine (CLARITIN) 10 MG tablet; Take 1 tablet (10 mg total) by mouth daily.  Essential hypertension - Increased from 2.5 mg amLODipine (NORVASC) 5 MG tablet; Take 1 tablet (5 mg total) by mouth daily. For blood pressure - CBC; Future - COMPLETE METABOLIC PANEL WITH GFR; Future - Lipid panel; Future - TSH; Future - Vitamin D, 25-hydroxy; Future   Due to language barrier, an interpreter was present during the history-taking and subsequent discussion (and for part of the physical exam) with this patient.  Return in about 2 weeks (around 08/17/2014) for Lab visit and Nurse Visit-BP check, 3 mo PCP.    Chari Manning, NP-C [facility] [phone] 08/03/2014, 3:52 PM

## 2014-08-21 ENCOUNTER — Ambulatory Visit: Payer: No Typology Code available for payment source | Attending: Internal Medicine

## 2014-08-21 VITALS — BP 137/92 | HR 79 | Temp 98.4°F | Resp 20

## 2014-08-21 DIAGNOSIS — I10 Essential (primary) hypertension: Secondary | ICD-10-CM

## 2014-08-21 LAB — LIPID PANEL
Cholesterol: 292 mg/dL — ABNORMAL HIGH (ref 0–200)
HDL: 52 mg/dL (ref >39)
LDL Cholesterol: 195 mg/dL — ABNORMAL HIGH (ref 0–99)
Total CHOL/HDL Ratio: 5.6 Ratio
Triglycerides: 227 mg/dL — ABNORMAL HIGH (ref <150)
VLDL: 45 mg/dL — ABNORMAL HIGH (ref 0–40)

## 2014-08-21 LAB — TSH: TSH: 2.218 u[IU]/mL (ref 0.350–4.500)

## 2014-08-21 LAB — COMPLETE METABOLIC PANEL WITH GFR
ALK PHOS: 64 U/L (ref 39–117)
ALT: 31 U/L (ref 0–35)
AST: 18 U/L (ref 0–37)
Albumin: 4.1 g/dL (ref 3.5–5.2)
BUN: 21 mg/dL (ref 6–23)
CHLORIDE: 102 meq/L (ref 96–112)
CO2: 28 meq/L (ref 19–32)
Calcium: 9.4 mg/dL (ref 8.4–10.5)
Creat: 0.74 mg/dL (ref 0.50–1.10)
GFR, Est African American: >89 mL/min
GFR, Est Non African American: 89 mL/min
Glucose, Bld: 95 mg/dL (ref 70–99)
POTASSIUM: 3.9 meq/L (ref 3.5–5.3)
SODIUM: 139 meq/L (ref 135–145)
TOTAL PROTEIN: 6.8 g/dL (ref 6.0–8.3)
Total Bilirubin: 0.9 mg/dL (ref 0.2–1.2)

## 2014-08-21 LAB — CBC
HCT: 38.2 % (ref 36.0–46.0)
HEMOGLOBIN: 12.6 g/dL (ref 12.0–15.0)
MCH: 29.6 pg (ref 26.0–34.0)
MCHC: 33 g/dL (ref 30.0–36.0)
MCV: 89.7 fL (ref 78.0–100.0)
PLATELETS: 228 10*3/uL (ref 150–400)
RBC: 4.26 MIL/uL (ref 3.87–5.11)
RDW: 13.6 % (ref 11.5–15.5)
WBC: 8.1 10*3/uL (ref 4.0–10.5)

## 2014-08-21 NOTE — Progress Notes (Unsigned)
Patient ID: Mary Hubbard, female   DOB: January 12, 1956, 58 y.o.   MRN: [id number] Patient presented for B/P check.  Currently taking Amlodipine 5 mg.  B/P was 137/92.

## 2014-08-21 NOTE — Patient Instructions (Signed)
Continue with current medications.  You will be called next week with Blood Work results.

## 2014-08-22 LAB — VITAMIN D 25 HYDROXY (VIT D DEFICIENCY, FRACTURES): VIT D 25 HYDROXY: 30 ng/mL (ref 30–89)

## 2014-08-24 ENCOUNTER — Telehealth: Payer: Self-pay | Admitting: Emergency Medicine

## 2014-08-24 MED ORDER — ATORVASTATIN CALCIUM 10 MG PO TABS: 10.0000 mg | ORAL_TABLET | Freq: Every day | ORAL | Status: DC

## 2014-08-24 NOTE — Telephone Encounter (Signed)
Message copied by Ricci Barker on Mon Aug 24, 2014  2:44 PM ------      Message from: Chari Manning A      Created: Sun Aug 23, 2014  9:11 PM       Please send prescription for atorvastatin 10 mg. Please go over diet and exercise with patient. ------

## 2014-08-31 ENCOUNTER — Encounter: Payer: Self-pay | Admitting: Internal Medicine

## 2014-09-03 ENCOUNTER — Ambulatory Visit: Payer: No Typology Code available for payment source | Attending: Internal Medicine

## 2014-12-04 ENCOUNTER — Ambulatory Visit: Payer: Self-pay | Attending: Internal Medicine

## 2015-06-07 ENCOUNTER — Ambulatory Visit: Payer: Self-pay | Attending: Internal Medicine

## 2015-06-07 ENCOUNTER — Other Ambulatory Visit: Payer: Self-pay | Admitting: Internal Medicine

## 2015-06-07 NOTE — Telephone Encounter (Signed)
Patient is requesting a refill on albuterol (PROVENTIL HFA;VENTOLIN HFA) 108 (90 BASE) MCG/ACT inhaler and a high blood pressure medication, she is not sure what the name is. Please follow up with pt to see if she is able to get a sample. Pt has an appt pending.

## 2015-06-16 ENCOUNTER — Encounter: Payer: Self-pay | Admitting: Internal Medicine

## 2015-06-16 ENCOUNTER — Ambulatory Visit: Payer: Self-pay | Attending: Internal Medicine | Admitting: Internal Medicine

## 2015-06-16 VITALS — BP 142/94 | HR 86 | Temp 98.1°F | Resp 18 | Ht 61.5 in | Wt 194.0 lb

## 2015-06-16 DIAGNOSIS — Z789 Other specified health status: Secondary | ICD-10-CM | POA: Insufficient documentation

## 2015-06-16 DIAGNOSIS — J4520 Mild intermittent asthma, uncomplicated: Secondary | ICD-10-CM | POA: Insufficient documentation

## 2015-06-16 DIAGNOSIS — I10 Essential (primary) hypertension: Secondary | ICD-10-CM | POA: Insufficient documentation

## 2015-06-16 DIAGNOSIS — Z2821 Immunization not carried out because of patient refusal: Secondary | ICD-10-CM

## 2015-06-16 DIAGNOSIS — Z720 Tobacco use: Secondary | ICD-10-CM | POA: Insufficient documentation

## 2015-06-16 MED ORDER — ALBUTEROL SULFATE HFA 108 (90 BASE) MCG/ACT IN AERS
2.0000 | INHALATION_SPRAY | Freq: Four times a day (QID) | RESPIRATORY_TRACT | Status: DC | PRN
Start: 2015-06-16 — End: 2015-07-14

## 2015-06-16 MED ORDER — AMLODIPINE BESYLATE 5 MG PO TABS: 5.0000 mg | ORAL_TABLET | Freq: Every day | ORAL | Status: DC

## 2015-06-16 NOTE — Progress Notes (Signed)
Patient ID: Mary Hubbard, female   DOB: 1956-10-26, 59 y.o.   MRN: [id number]  CC: HTN/Asthma f/u  HPI: Mary Hubbard is a 59 y.o. female here today for a follow up visit.  Patient has past medical history of HTN and asthma.  Patient reports that she has been out of her blood pressure for several months because she did not know to follow back up for more refills. She reports some headaches and dizziness. She denies chest pain, palpitations, edema.   She reports increased SOB with exertion. She reports being out of albuterol as well. She denies nighttime awakening due to asthma symptoms. She does report mild snoring.   No Known Allergies Past Medical History  Diagnosis Date  . Hypertension   . Joint pain   . Asthma    Current Outpatient Prescriptions on File Prior to Visit  Medication Sig Dispense Refill  . albuterol (PROVENTIL HFA;VENTOLIN HFA) 108 (90 BASE) MCG/ACT inhaler Inhale 2 puffs into the lungs every 6 (six) hours as needed for wheezing or shortness of breath. 3 Inhaler 3  . amLODipine (NORVASC) 5 MG tablet Take 1 tablet (5 mg total) by mouth daily. For blood pressure 90 tablet 3  . atorvastatin (LIPITOR) 10 MG tablet Take 1 tablet (10 mg total) by mouth daily. 90 tablet 3  . loratadine (CLARITIN) 10 MG tablet Take 1 tablet (10 mg total) by mouth daily. (Patient not taking: Reported on 06/16/2015) 30 tablet 11  . predniSONE (DELTASONE) 50 MG tablet Take 0.5 tablets (25 mg total) by mouth daily with breakfast. (Patient not taking: Reported on 06/16/2015) 7 tablet 0   No current facility-administered medications on file prior to visit.   History reviewed. No pertinent family history. Social History   Social History  . Marital Status: Widowed    Spouse Name: N/A  . Number of Children: N/A  . Years of Education: N/A   Occupational History  . unemployed [place] History Main Topics  . Smoking status: Current Every Day Smoker    Types: Cigarettes  . Smokeless tobacco: Never Used   . Alcohol Use: No  . Drug Use: No  . Sexual Activity: No   Other Topics Concern  . Not on file   Social History Narrative    Review of Systems  Respiratory: Positive for shortness of breath. Negative for cough and wheezing.   Cardiovascular: Negative.   Neurological: Positive for dizziness and headaches.  All other systems reviewed and are negative.  Objective:   Filed Vitals:   06/16/15 1519  BP: 142/94  Pulse: 86  Temp: 98.1 F (36.7 C)  Resp: 18    Physical Exam  Constitutional: She is oriented to person, place, and time.  Cardiovascular: Normal rate, regular rhythm and normal heart sounds.   Pulmonary/Chest: Effort normal and breath sounds normal. She has no wheezes.  Neurological: She is alert and oriented to person, place, and time.     Lab Results  Component Value Date   WBC 8.1 08/21/2014   HGB 12.6 08/21/2014   HCT 38.2 08/21/2014   MCV 89.7 08/21/2014   PLT 228 08/21/2014   Lab Results  Component Value Date   CREATININE 0.74 08/21/2014   BUN 21 08/21/2014   NA 139 08/21/2014   K 3.9 08/21/2014   CL 102 08/21/2014   CO2 28 08/21/2014    Lab Results  Component Value Date   HGBA1C 4.9 04/20/2014   Lipid Panel     Component Value Date/Time  CHOL 292* 08/21/2014 0947   TRIG 227* 08/21/2014 0947   HDL 52 08/21/2014 0947   CHOLHDL 5.6 08/21/2014 0947   VLDL 45* 08/21/2014 0947   LDLCALC 195* 08/21/2014 0947       Assessment and plan:   Mary Hubbard was seen today for asthma and hypertension.  Diagnoses and all orders for this visit:  Essential hypertension -     amLODipine (NORVASC) 5 MG tablet; Take 1 tablet (5 mg total) by mouth daily. For blood pressure BP control uncertain, will begin back on medication and recheck. Dash diet advised  Asthma, mild intermittent, uncomplicated -     albuterol (PROVENTIL HFA;VENTOLIN HFA) 108 (90 BASE) MCG/ACT inhaler; Inhale 2 puffs into the lungs every 6 (six) hours as needed for wheezing or shortness  of breath. Stable, refill meds   Return in about 3 months (around 09/16/2015) for Hypertension.       Lance Bosch, [facility] [phone] 06/16/2015, 3:52 PM

## 2015-06-16 NOTE — Progress Notes (Signed)
Interpreter name: win khine Language resosurces  Pt's here for HTN and Asthma F/up. Pt states she having problems with HTN off and on recently. Pt reports asthma attack happens when she walks and feels tired afterwards. Pt problems started when she arrived in the [place] for 6[place]7 y[place] ago. Pt reports feeling pressure in temple.   No meds taken today.  Pt's here for Rx refill on Albuterol, Amlodipine.  Pt decline Tdap at this time until her new [employer] card comes in.

## 2015-07-14 ENCOUNTER — Other Ambulatory Visit: Payer: Self-pay | Admitting: Internal Medicine

## 2015-07-14 DIAGNOSIS — J4520 Mild intermittent asthma, uncomplicated: Secondary | ICD-10-CM

## 2015-07-14 MED ORDER — ALBUTEROL SULFATE HFA 108 (90 BASE) MCG/ACT IN AERS: 2.0000 | INHALATION_SPRAY | Freq: Four times a day (QID) | RESPIRATORY_TRACT | Status: DC | PRN

## 2015-07-27 ENCOUNTER — Ambulatory Visit: Payer: Self-pay

## 2015-09-22 ENCOUNTER — Encounter: Payer: Self-pay | Admitting: Internal Medicine

## 2015-09-22 ENCOUNTER — Ambulatory Visit: Payer: Self-pay | Attending: Internal Medicine | Admitting: Internal Medicine

## 2015-09-22 VITALS — BP 155/105 | HR 99 | Temp 98.5°F | Resp 17 | Ht 61.0 in | Wt 193.0 lb

## 2015-09-22 DIAGNOSIS — E785 Hyperlipidemia, unspecified: Secondary | ICD-10-CM

## 2015-09-22 DIAGNOSIS — M6289 Other specified disorders of muscle: Secondary | ICD-10-CM

## 2015-09-22 DIAGNOSIS — G729 Myopathy, unspecified: Secondary | ICD-10-CM

## 2015-09-22 DIAGNOSIS — J4520 Mild intermittent asthma, uncomplicated: Secondary | ICD-10-CM

## 2015-09-22 DIAGNOSIS — I10 Essential (primary) hypertension: Secondary | ICD-10-CM

## 2015-09-22 MED ORDER — ATORVASTATIN CALCIUM 10 MG PO TABS: 10.0000 mg | ORAL_TABLET | Freq: Every day | ORAL | Status: DC

## 2015-09-22 MED ORDER — ALBUTEROL SULFATE HFA 108 (90 BASE) MCG/ACT IN AERS
2.0000 | INHALATION_SPRAY | Freq: Four times a day (QID) | RESPIRATORY_TRACT | Status: DC | PRN
Start: 2015-09-22 — End: 2015-09-22

## 2015-09-22 MED ORDER — DICLOFENAC SODIUM 1 % TD GEL: 2.0000 g | Freq: Three times a day (TID) | TRANSDERMAL | Status: DC | PRN

## 2015-09-22 MED ORDER — AMLODIPINE BESYLATE 5 MG PO TABS: 5.0000 mg | ORAL_TABLET | Freq: Every day | ORAL | Status: DC

## 2015-09-22 MED ORDER — ALBUTEROL SULFATE HFA 108 (90 BASE) MCG/ACT IN AERS: 2.0000 | INHALATION_SPRAY | Freq: Four times a day (QID) | RESPIRATORY_TRACT | Status: DC | PRN

## 2015-09-22 NOTE — Progress Notes (Signed)
Patient ID: Mary Hubbard, female   DOB: May 27, 1956, 59 y.o.   MRN: [id number] Subjective:  Mary Hubbard is a 59 y.o. female with hypertension and hyperlipidemia.  Patient reports that she takes her medications at night but has been out of her medication 2 days ago. Patient is present with her daughter who states that the patient has been out of medication for several months because she never came back to pick up refills of any medication. Daughter states that the mother never told her she needed to pick up refills. When discussed with pharmacy patient has not picked up her medication in 3 months.  Patient would like refills of her asthma inhaler. Current Outpatient Prescriptions  Medication Sig Dispense Refill  . albuterol (PROVENTIL HFA;VENTOLIN HFA) 108 (90 BASE) MCG/ACT inhaler Inhale 2 puffs into the lungs every 6 (six) hours as needed for wheezing or shortness of breath. 3 Inhaler 3  . amLODipine (NORVASC) 5 MG tablet Take 1 tablet (5 mg total) by mouth daily. For blood pressure 90 tablet 3  . atorvastatin (LIPITOR) 10 MG tablet Take 1 tablet (10 mg total) by mouth daily. 90 tablet 3  . loratadine (CLARITIN) 10 MG tablet Take 1 tablet (10 mg total) by mouth daily. (Patient not taking: Reported on 06/16/2015) 30 tablet 11  . predniSONE (DELTASONE) 50 MG tablet Take 0.5 tablets (25 mg total) by mouth daily with breakfast. (Patient not taking: Reported on 06/16/2015) 7 tablet 0   No current facility-administered medications for this visit.    Hypertension ROS: taking medications as instructed, no medication side effects noted, no TIA's, no chest pain on exertion, no dyspnea on exertion, no swelling of ankles and no palpitations. trapezius muscle tightness All other systems negative  Objective:  BP 155/105 mmHg  Pulse 99  Temp(Src) 98.5 F (36.9 C)  Resp 17  Ht 5\' 1"  (1.549 m)  Wt 193 lb (87.544 kg)  BMI 36.49 kg/m2  SpO2 100%  Appearance alert, well appearing, and in no distress, oriented to person,  place, and time and overweight. General exam BP noted to be moderately elevated today in office, S1, S2 normal, no gallop, no murmur, chest clear, no JVD, no HSM, no edema.Lungs clear to auscultation.    Lab review: Last LDL was not at goal 07/2014. Will recheck in 1 week.  Assessment:   Myrth was seen today for follow-up.  Diagnoses and all orders for this visit:  Essential hypertension -     amLODipine (NORVASC) 5 MG tablet; Take 1 tablet (5 mg total) by mouth daily. For blood pressure Patient blood pressure remains elevated today due to non-compliance., will increase BP medication and have patient to return in 2 weeks for blood pressure recheck with nurse. Stressed diet changes, regular exercise regimen, and modifiable risk factors.  Asthma, mild intermittent, uncomplicated -     albuterol (PROVENTIL HFA;VENTOLIN HFA) 108 (90 BASE) MCG/ACT inhaler; Inhale 2 puffs into the lungs every 6 (six) hours as needed for wheezing or shortness of breath. Stable, meds refilled   HLD (hyperlipidemia) -     atorvastatin (LIPITOR) 10 MG tablet; Take 1 tablet (10 mg total) by mouth daily. Stressed that she needs to take medication as directed.   Muscle tightness -     diclofenac sodium (VOLTAREN) 1 % GEL; Apply 2 g topically 3 (three) times daily as needed.   Due to language barrier, an interpreter was present during the history-taking and subsequent discussion (and for part of the physical exam) with this  patient.  Return in about 1 week (around 09/29/2015) for Lab Visit and 3 mo PCP .  Lance Bosch, NP 09/22/2015 12:56 PM

## 2015-09-22 NOTE — Progress Notes (Signed)
Interpreter line used Patient is here for follow up on her HTN Patient present in office with elevated blood pressure Patient states she takes her medication at night

## 2015-09-29 ENCOUNTER — Ambulatory Visit: Payer: Self-pay | Attending: Internal Medicine

## 2015-09-29 DIAGNOSIS — Z029 Encounter for administrative examinations, unspecified: Secondary | ICD-10-CM | POA: Insufficient documentation

## 2015-09-29 DIAGNOSIS — Z0000 Encounter for general adult medical examination without abnormal findings: Secondary | ICD-10-CM

## 2015-09-29 LAB — BASIC METABOLIC PANEL
BUN: 21 mg/dL (ref 7–25)
CALCIUM: 9 mg/dL (ref 8.6–10.4)
CO2: 26 mmol/L (ref 20–31)
CREATININE: 0.67 mg/dL (ref 0.50–0.99)
Chloride: 108 mmol/L (ref 98–110)
Glucose, Bld: 92 mg/dL (ref 65–99)
Potassium: 3.9 mmol/L (ref 3.5–5.3)
Sodium: 143 mmol/L (ref 135–146)

## 2015-09-29 LAB — CBC WITH DIFFERENTIAL/PLATELET
BASOS ABS: 0.1 10*3/uL (ref 0.0–0.1)
BASOS PCT: 1 % (ref 0–1)
EOS ABS: 0.3 10*3/uL (ref 0.0–0.7)
EOS PCT: 4 % (ref 0–5)
HCT: 37.8 % (ref 36.0–46.0)
Hemoglobin: 12.7 g/dL (ref 12.0–15.0)
Lymphocytes Relative: 31 % (ref 12–46)
Lymphs Abs: 2.3 10*3/uL (ref 0.7–4.0)
MCH: 30 pg (ref 26.0–34.0)
MCHC: 33.6 g/dL (ref 30.0–36.0)
MCV: 89.4 fL (ref 78.0–100.0)
MPV: 11.6 fL (ref 8.6–12.4)
Monocytes Absolute: 0.5 10*3/uL (ref 0.1–1.0)
Monocytes Relative: 7 % (ref 3–12)
NEUTROS PCT: 57 % (ref 43–77)
Neutro Abs: 4.3 10*3/uL (ref 1.7–7.7)
PLATELETS: 197 10*3/uL (ref 150–400)
RBC: 4.23 MIL/uL (ref 3.87–5.11)
RDW: 13.2 % (ref 11.5–15.5)
WBC: 7.5 10*3/uL (ref 4.0–10.5)

## 2015-09-29 LAB — LIPID PANEL
CHOL/HDL RATIO: 4.6 ratio (ref <=5.0)
CHOLESTEROL: 204 mg/dL — AB (ref 125–200)
HDL: 44 mg/dL — AB (ref >=46)
LDL CALC: 129 mg/dL (ref <130)
Triglycerides: 153 mg/dL — ABNORMAL HIGH (ref <150)
VLDL: 31 mg/dL — AB (ref <30)

## 2015-10-01 ENCOUNTER — Telehealth: Payer: Self-pay

## 2015-10-01 MED ORDER — ATORVASTATIN CALCIUM 20 MG PO TABS: 20.0000 mg | ORAL_TABLET | Freq: Every day | ORAL | Status: DC

## 2015-10-01 NOTE — Telephone Encounter (Signed)
-----   Message from Lance Bosch, NP sent at 09/30/2015  8:36 AM EST ----- Increase Lipitor to 20 mg daily. Please go over diet changes again.

## 2015-10-01 NOTE — Telephone Encounter (Signed)
Interpreter line used Mary Hubbard ID# ID# [id number] Called patient  Patient not available Message left on voice mail to return our call

## 2016-01-31 DIAGNOSIS — I10 Essential (primary) hypertension: Secondary | ICD-10-CM

## 2016-01-31 NOTE — Congregational Nurse Program (Signed)
Congregational Nurse Program Note  Date of Encounter: 01/20/2016 Past Medical History: Past Medical History  Diagnosis Date  . Hypertension   . Joint pain   . Asthma     Encounter Details:     CNP Questionnaire - 01/20/16 1230    Patient Demographics   Is this a new or existing patient? Existing   Patient is considered a/an Refugee   Race Asian   Patient Assistance   Location of Patient [place]   Patient's financial/insurance status [facility] Card/Care Connects   Uninsured Patient Yes   Patient referred to apply for the following financial assistance [facility] [employer] insecurities addressed Not Applicable   Transportation assistance No   Assistance securing medications No   Educational health offerings Acute disease;Cardiac disease;Hypertension;Health literacy;Medications;Nutrition   Encounter Details   Primary purpose of visit Education/Health Concerns;Navigating the Healthcare System;Other   Was an Emergency Department visit averted? Yes   Does patient have a medical provider? No   Patient referred to Doctor referral for an emergent behavioral health crisis;Establish PCP;Medication Assistance Programs   Was a mental health screening completed? (GAINS tool) No   Does patient have dental issues? No   Does patient have vision issues? No   Since previous encounter, have you referred patient for abnormal blood pressure that resulted in a new diagnosis or medication change? No   Since previous encounter, have you referred patient for abnormal blood glucose that resulted in a new diagnosis or medication change? No   For Abstraction Use Only   Does patient have insurance? No    Mary Hubbard arrived at the [facility] clinic to apply for an [employer]. We have been working with [name] to get the [employer] to see her physician for an ever increasing elevation in her blood pressure. Today value 160/100. She has successfully gotten her OC and the process  for the [facility] was explained. She was referred to the Urgent Care at [facility], but refused. I made it clear, if she was not able to get and OC for [facility] or [facility] she was to be assigned to the [facility] and I would help make her first appointment. [name] and her daughter-in-law are aware that her current BP level is dangerous to her health and she needs to be seen as soon as possible. NOTE: she states she has no medications.

## 2016-01-31 NOTE — Congregational Nurse Program (Unsigned)
Note: Clinic was held on March 30th, 2017 at [facility]. Mary Hubbard was asked to return for a follow up on her blood pressure and to make an appointment with the primary physician indicated on her [facility]. Mary Hubbard was a no show at the clinic.  Mary Boots RN Congregational Nurse at [facility] Nurse Program Note  Date of Encounter: 01/20/2016 Past Medical History: Past Medical History  Diagnosis Date  . Hypertension   . Joint pain   . Asthma     Encounter Details:     CNP Questionnaire - 01/20/16 1230    Patient Demographics   Is this a new or existing patient? Existing   Patient is considered a/an Refugee   Race Asian   Patient Assistance   Location of Patient [place]   Patient's financial/insurance status [employer] Card/Care Connects   Uninsured Patient Yes   Patient referred to apply for the following financial assistance [facility] [employer] insecurities addressed Not Applicable   Transportation assistance No   Assistance securing medications No   Educational health offerings Acute disease;Cardiac disease;Hypertension;Health literacy;Medications;Nutrition   Encounter Details   Primary purpose of visit Education/Health Concerns;Navigating the Healthcare System;Other   Was an Emergency Department visit averted? Yes   Does patient have a medical provider? No   Patient referred to Doctor referral for an emergent behavioral health crisis;Establish PCP;Medication Assistance Programs   Was a mental health screening completed? (GAINS tool) No   Does patient have dental issues? No   Does patient have vision issues? No   Since previous encounter, have you referred patient for abnormal blood pressure that resulted in a new diagnosis or medication change? No   Since previous encounter, have you referred patient for abnormal blood glucose that resulted in a new diagnosis or medication change? No   For Abstraction Use Only   Does  patient have insurance? No    Mary Hubbard arrived at the [facility] clinic to apply for an [employer]. We have been working with Mary Hubbard to get the [employer] to see her physician for an ever increasing elevation in her blood pressure. Today value 160/100. She has successfully gotten her OC and the process for the [facility] was explained. She was referred to the Urgent Care at [facility], but refused. I made it clear, if she was not able to get and OC for [facility] or [facility] she was to be assigned to the [facility] and I would help make her first appointment. Mary Hubbard and her daughter-in-law are aware that her current BP level is dangerous to her health and she needs to be seen as soon as possible. NOTE: she states she has no medications.

## 2016-12-14 ENCOUNTER — Ambulatory Visit (HOSPITAL_COMMUNITY)
Admission: EM | Admit: 2016-12-14 | Discharge: 2016-12-14 | Disposition: A | Discharge status: Home / Self Care | Payer: Self-pay | Attending: Family Medicine | Admitting: Family Medicine

## 2016-12-14 ENCOUNTER — Encounter (HOSPITAL_COMMUNITY): Payer: Self-pay | Admitting: Family Medicine

## 2016-12-14 DIAGNOSIS — I10 Essential (primary) hypertension: Secondary | ICD-10-CM

## 2016-12-14 DIAGNOSIS — G43009 Migraine without aura, not intractable, without status migrainosus: Secondary | ICD-10-CM

## 2016-12-14 DIAGNOSIS — J4520 Mild intermittent asthma, uncomplicated: Secondary | ICD-10-CM

## 2016-12-14 MED ORDER — CLONIDINE HCL 0.1 MG PO TABS
ORAL_TABLET | ORAL | Status: AC
  Filled 2016-12-14: qty 1

## 2016-12-14 MED ORDER — BUTALBITAL-APAP-CAFFEINE 50-325-40 MG PO TABS: 1.0000 | ORAL_TABLET | Freq: Four times a day (QID) | ORAL | 0 refills | Status: AC | PRN

## 2016-12-14 MED ORDER — AMLODIPINE BESYLATE 5 MG PO TABS: 5.0000 mg | ORAL_TABLET | Freq: Every day | ORAL | 3 refills | Status: AC

## 2016-12-14 MED ORDER — ALBUTEROL SULFATE HFA 108 (90 BASE) MCG/ACT IN AERS: 2.0000 | INHALATION_SPRAY | Freq: Four times a day (QID) | RESPIRATORY_TRACT | 3 refills | Status: DC | PRN

## 2016-12-14 MED ORDER — CLONIDINE HCL 0.1 MG PO TABS
0.1000 mg | ORAL_TABLET | Freq: Once | ORAL | Status: AC
  Administered 2016-12-14: 0.1 mg via ORAL

## 2016-12-14 NOTE — ED Triage Notes (Signed)
Via phone interpreter ID # [phone]  Here for HA onset 24 hours   Seen by [facility] Nurse and was found to have BP of 202/130 done at 1730  Denies CP  Has not had BP since 08/2016  A&O x4... NAD

## 2016-12-14 NOTE — ED Provider Notes (Addendum)
[place]    CSN: [id number] Arrival date & time: 12/14/16  [place]     History   Chief Complaint Chief Complaint  Patient presents with  . Migraine    HPI Mary Hubbard is a 61 y.o. female.   This is 61 year old woman that presents to the [facility] urgent care center with headache. She has a history of hypertension and ran out of her hypertension medicine in November. Patient is taken amlodipine for blood pressure which has worked well for her in the past. She denies chest pain at the present time. She also denies nausea or vomiting, change in her vision, change in her hearing.  Patient also notes some intermittent shortness of breath when she walks long distances. She uses Proventil before and this has helped her.  Patient states that she's had the headache for 3 days. It's global and somewhat throbbing.  Patient was seen in the company of her daughter. Translation services were used to communicate.      Past Medical History:  Diagnosis Date  . Asthma   . Hypertension   . Joint pain     Patient Active Problem List   Diagnosis Date Noted  . Asthma due to seasonal allergies 04/20/2014  . HTN (hypertension) 04/20/2014  . Preventative health care 04/20/2014    Past Surgical History:  Procedure Laterality Date  . APPENDECTOMY     20 years ago    OB History    Gravida Para Term Preterm AB Living   2         2   SAB TAB Ectopic Multiple Live Births                   Home Medications    Prior to Admission medications   Medication Sig Start Date End Date Taking? Authorizing Provider  albuterol (PROVENTIL HFA;VENTOLIN HFA) 108 (90 Base) MCG/ACT inhaler Inhale 2 puffs into the lungs every 6 (six) hours as needed for wheezing or shortness of breath. 12/14/16   Robyn Haber, MD  amLODipine (NORVASC) 5 MG tablet Take 1 tablet (5 mg total) by mouth daily. For blood pressure 12/14/16   Robyn Haber, MD  atorvastatin (LIPITOR) 20 MG  tablet Take 1 tablet (20 mg total) by mouth daily. 10/01/15   Lance Bosch, NP  butalbital-acetaminophen-caffeine (FIORICET, ESGIC) [phone] MG tablet Take 1-2 tablets by mouth every 6 (six) hours as needed for headache. 12/14/16 12/14/17  Robyn Haber, MD  diclofenac sodium (VOLTAREN) 1 % GEL Apply 2 g topically 3 (three) times daily as needed. 09/22/15   Lance Bosch, NP    Family History History reviewed. No pertinent family history.  Social History Social History  Substance Use Topics  . Smoking status: Current Every Day Smoker    Types: Cigarettes  . Smokeless tobacco: Never Used  . Alcohol use No     Allergies   Patient has no known allergies.   Review of Systems Review of Systems  Constitutional: Negative.   HENT: Negative.   Respiratory: Positive for shortness of breath.   Gastrointestinal: Negative.   Musculoskeletal: Negative.   Neurological: Positive for headaches.  Psychiatric/Behavioral: Negative.      Physical Exam Triage Vital Signs ED Triage Vitals  Enc Vitals Group     BP      Pulse      Resp      Temp      Temp src      SpO2  Weight      Height      Head Circumference      Peak Flow      Pain Score      Pain Loc      Pain Edu?      Excl. in [place]?    No data found.   Updated Vital Signs BP 167/97 (BP Location: Left Arm)   Pulse 79   Temp 98 F (36.7 C) (Oral)   Resp 20   SpO2 96%    Physical Exam  Constitutional: She is oriented to person, place, and time. She appears well-developed and well-nourished.  HENT:  Head: Normocephalic.  Right Ear: External ear normal.  Left Ear: External ear normal.  Mouth/Throat: Oropharynx is clear and moist.  Eyes: Conjunctivae and EOM are normal. Pupils are equal, round, and reactive to light.  Neck: Normal range of motion. Neck supple.  Cardiovascular: Normal rate, regular rhythm and normal heart sounds.   Pulmonary/Chest: Effort normal and breath sounds normal.  Musculoskeletal:  Normal range of motion.  Neurological: She is alert and oriented to person, place, and time.  Skin: Skin is warm and dry.  Nursing note and vitals reviewed.    UC Treatments / Results  Labs (all labs ordered are listed, but only abnormal results are displayed) Labs Reviewed - No data to display  EKG  EKG Interpretation None       Radiology No results found.  Procedures Procedures (including critical care time)  Medications Ordered in UC Medications  cloNIDine (CATAPRES) tablet 0.1 mg (not administered)     Initial Impression / Assessment and Plan / UC Course  I have reviewed the triage vital signs and the nursing notes.  Pertinent labs & imaging results that were available during my care of the patient were reviewed by me and considered in my medical decision making (see chart for details).     Final Clinical Impressions(s) / UC Diagnoses   Final diagnoses:  Essential hypertension  Migraine without aura and without status migrainosus, not intractable  Mild intermittent reactive airway disease without complication    New Prescriptions New Prescriptions   BUTALBITAL-ACETAMINOPHEN-CAFFEINE (FIORICET, ESGIC) 50-325-40 MG TABLET    Take 1-2 tablets by mouth every 6 (six) hours as needed for headache.  Amlodipine and albuterol were refilled Patient was instructed to follow-up with her doctor in a month   Robyn Haber, MD 12/14/16 1906    Robyn Haber, MD 12/14/16 [id number]

## 2017-01-10 ENCOUNTER — Ambulatory Visit (INDEPENDENT_AMBULATORY_CARE_PROVIDER_SITE_OTHER): Payer: Self-pay | Admitting: Physician Assistant

## 2017-04-04 ENCOUNTER — Ambulatory Visit: Payer: Self-pay

## 2021-03-10 ENCOUNTER — Other Ambulatory Visit: Payer: Self-pay

## 2021-03-10 ENCOUNTER — Encounter (HOSPITAL_COMMUNITY): Payer: Self-pay | Admitting: Emergency Medicine

## 2021-03-10 ENCOUNTER — Emergency Department (HOSPITAL_COMMUNITY)
Admission: EM | Admit: 2021-03-10 | Discharge: 2021-03-10 | Disposition: A | Discharge status: Home / Self Care | Payer: Medicaid Other | Attending: Emergency Medicine | Admitting: Emergency Medicine

## 2021-03-10 ENCOUNTER — Emergency Department (HOSPITAL_COMMUNITY): Payer: Medicaid Other

## 2021-03-10 DIAGNOSIS — Z79899 Other long term (current) drug therapy: Secondary | ICD-10-CM | POA: Insufficient documentation

## 2021-03-10 DIAGNOSIS — J45909 Unspecified asthma, uncomplicated: Secondary | ICD-10-CM | POA: Insufficient documentation

## 2021-03-10 DIAGNOSIS — F17210 Nicotine dependence, cigarettes, uncomplicated: Secondary | ICD-10-CM | POA: Insufficient documentation

## 2021-03-10 DIAGNOSIS — N938 Other specified abnormal uterine and vaginal bleeding: Secondary | ICD-10-CM

## 2021-03-10 DIAGNOSIS — N939 Abnormal uterine and vaginal bleeding, unspecified: Secondary | ICD-10-CM | POA: Diagnosis present

## 2021-03-10 DIAGNOSIS — R42 Dizziness and giddiness: Secondary | ICD-10-CM | POA: Diagnosis not present

## 2021-03-10 DIAGNOSIS — I10 Essential (primary) hypertension: Secondary | ICD-10-CM | POA: Insufficient documentation

## 2021-03-10 DIAGNOSIS — N390 Urinary tract infection, site not specified: Secondary | ICD-10-CM

## 2021-03-10 LAB — URINALYSIS, ROUTINE W REFLEX MICROSCOPIC
Bilirubin Urine: NEGATIVE
Glucose, UA: NEGATIVE mg/dL
Ketones, ur: NEGATIVE mg/dL
Nitrite: NEGATIVE
Protein, ur: 30 mg/dL — AB
RBC / HPF: >50 RBC/hpf — ABNORMAL HIGH (ref 0–5)
Specific Gravity, Urine: 1.02 (ref 1.005–1.030)
WBC, UA: >50 WBC/hpf — ABNORMAL HIGH (ref 0–5)
pH: 5 (ref 5.0–8.0)

## 2021-03-10 LAB — COMPREHENSIVE METABOLIC PANEL
ALT: 25 U/L (ref 0–44)
AST: 21 U/L (ref 15–41)
Albumin: 3.5 g/dL (ref 3.5–5.0)
Alkaline Phosphatase: 52 U/L (ref 38–126)
Anion gap: 8 (ref 5–15)
BUN: 22 mg/dL (ref 8–23)
CO2: 25 mmol/L (ref 22–32)
Calcium: 8.7 mg/dL — ABNORMAL LOW (ref 8.9–10.3)
Chloride: 106 mmol/L (ref 98–111)
Creatinine, Ser: 0.77 mg/dL (ref 0.44–1.00)
GFR, Estimated: >60 mL/min (ref >60)
Glucose, Bld: 117 mg/dL — ABNORMAL HIGH (ref 70–99)
Potassium: 3.3 mmol/L — ABNORMAL LOW (ref 3.5–5.1)
Sodium: 139 mmol/L (ref 135–145)
Total Bilirubin: 1.1 mg/dL (ref 0.3–1.2)
Total Protein: 6.6 g/dL (ref 6.5–8.1)

## 2021-03-10 LAB — WET PREP, GENITAL
Sperm: NONE SEEN
Trich, Wet Prep: NONE SEEN
Yeast Wet Prep HPF POC: NONE SEEN

## 2021-03-10 LAB — CBC
HCT: 39.1 % (ref 36.0–46.0)
Hemoglobin: 12.5 g/dL (ref 12.0–15.0)
MCH: 29.8 pg (ref 26.0–34.0)
MCHC: 32 g/dL (ref 30.0–36.0)
MCV: 93.3 fL (ref 80.0–100.0)
Platelets: 198 10*3/uL (ref 150–400)
RBC: 4.19 MIL/uL (ref 3.87–5.11)
RDW: 12.9 % (ref 11.5–15.5)
WBC: 7.5 10*3/uL (ref 4.0–10.5)
nRBC: 0 % (ref 0.0–0.2)

## 2021-03-10 MED ORDER — CEPHALEXIN 500 MG PO CAPS
500.0000 mg | ORAL_CAPSULE | Freq: Four times a day (QID) | ORAL | 0 refills | Status: DC
  Filled 2021-03-10: qty 28, 7d supply, fill #0

## 2021-03-10 MED ORDER — CEPHALEXIN 500 MG PO CAPS: 500.0000 mg | ORAL_CAPSULE | Freq: Four times a day (QID) | ORAL | 0 refills | Status: AC

## 2021-03-10 MED ORDER — SODIUM CHLORIDE 0.9 % IV SOLN
1.0000 g | Freq: Once | INTRAVENOUS | Status: AC
  Administered 2021-03-10: 1 g via INTRAVENOUS
  Filled 2021-03-10: qty 10

## 2021-03-10 NOTE — Discharge Instructions (Addendum)
Call the OB/GYN today to schedule a follow-up appointment in the next 1 to 2 weeks. Please take your antibiotics as prescribed.

## 2021-03-10 NOTE — ED Triage Notes (Signed)
Pt started having vaginal bleeding off an on since last month. Pt states it feels like a period. Pt has intermittent cramping.

## 2021-03-10 NOTE — ED Provider Notes (Signed)
Blood pressure (!) 160/98, pulse 73, temperature 98.4 F (36.9 C), temperature source Oral, resp. rate 17, SpO2 99 %.  Assuming care from Dr. Johnney Killian.  In short, Mary Hubbard is a 66 y.o. female with a chief complaint of Vaginal Bleeding .  Refer to the original H&P for additional details.  The current plan of care is to follow up vaginal [place] and reassess. Patient with evidence of UTI on UA. Plan for Keflex after receiving Rocephin here in the ED.   04:07 PM  Patient's ultrasound resulting showing a prominent endometrium.  Radiology is recommending endometrial sampling followed by sonohysterogram if that is negative.  Will refer patient to GYN for this evaluation to be done as an outpatient.   Discussed the test results with the patient using a [place] Glad interpreter.  The patient's family members at bedside.  She will assist the patient with making the phone call to OB/GYN this afternoon or first thing tomorrow.  Provided paper copy of prescription for UTI.    Margette Fast, MD 03/10/21 1626

## 2021-03-10 NOTE — ED Provider Notes (Signed)
[place] EMERGENCY DEPARTMENT Provider Note   CSN: [id number] Arrival date & time: 03/10/21  1004     History Chief Complaint  Patient presents with  . Vaginal Bleeding    Mary Hubbard is a 65 y.o. female.  HPI Patient's daughter is Optometrist.  Patient has been having sporadic vaginal bleeding for maybe up to 6 months.  Her daughter describes it as being similar to menstrual bleeding, however the patient has stopped having menstrual cycles for over 10 years.  She does not have associated pain.  Her daughter reports occasionally see complains of some lightheadedness but has no syncopal episode. patient reports other than the bleeding she feels quite well.  She denies feeling nauseated, reports she is eating well, no unusual weight loss.  Patient has not been seen by gynecologist for quite some time.  Her daughter does describe one visit for this complaint whereupon they were told everything was okay.  Patient has not any blood thinners.  She does not take aspirin.  Patient reports she has had 1 area on the outside aspect of her right lower leg that has been tender.    Past Medical History:  Diagnosis Date  . Asthma   . Hypertension   . Joint pain     Patient Active Problem List   Diagnosis Date Noted  . Asthma due to seasonal allergies 04/20/2014  . HTN (hypertension) 04/20/2014  . Preventative health care 04/20/2014    Past Surgical History:  Procedure Laterality Date  . APPENDECTOMY     20 years ago     OB History    Gravida  2   Para      Term      Preterm      AB      Living  2     SAB      IAB      Ectopic      Multiple      Live Births              No family history on file.  Social History   Tobacco Use  . Smoking status: Current Every Day Smoker    Types: Cigarettes  . Smokeless tobacco: Never Used  Substance Use Topics  . Alcohol use: No  . Drug use: No    Home Medications Prior to Admission medications    Medication Sig Start Date End Date Taking? Authorizing Provider  albuterol (PROVENTIL HFA;VENTOLIN HFA) 108 (90 Base) MCG/ACT inhaler Inhale 2 puffs into the lungs every 6 (six) hours as needed for wheezing or shortness of breath. 12/14/16   Robyn Haber, MD  amLODipine (NORVASC) 5 MG tablet Take 1 tablet (5 mg total) by mouth daily. For blood pressure 12/14/16   Robyn Haber, MD  atorvastatin (LIPITOR) 20 MG tablet Take 1 tablet (20 mg total) by mouth daily. 10/01/15   Lance Bosch, NP  diclofenac sodium (VOLTAREN) 1 % GEL Apply 2 g topically 3 (three) times daily as needed. 09/22/15   Lance Bosch, NP    Allergies    Patient has no known allergies.  Review of Systems   Review of Systems 10 systems reviewed and negative except as per HPI Physical Exam Updated Vital Signs BP (!) 160/98   Pulse 73   Temp 98.4 F (36.9 C) (Oral)   Resp 17   SpO2 99%   Physical Exam Constitutional:      Appearance: She is well-developed.  HENT:  Head: Normocephalic and atraumatic.     Mouth/Throat:     Pharynx: Oropharynx is clear.  Eyes:     Conjunctiva/sclera: Conjunctivae normal.  Cardiovascular:     Rate and Rhythm: Normal rate and regular rhythm.     Heart sounds: Normal heart sounds.  Pulmonary:     Effort: Pulmonary effort is normal.     Breath sounds: Normal breath sounds.  Abdominal:     General: Bowel sounds are normal. There is no distension.     Palpations: Abdomen is soft.     Tenderness: There is no abdominal tenderness.  Genitourinary:    Comments: Normal external female genitalia.  Speculum exam shows a moderate amount of thin brownish-red blood in the vault.  No other drainage or discharge.  No obvious mass or tumor.  Cervix is somewhat retroverted.  Bimanual no palpable mass in the vaginal canal.  Pelvic abdomen nontender Musculoskeletal:        General: No swelling. Normal range of motion.     Cervical back: Neck supple.     Right lower leg: No edema.      Left lower leg: No edema.     Comments: Both lower extremities are symmetric.  There is no peripheral edema.  Skin condition is good.  The feet are warm and dry.  Distal pulses are 2+ and symmetric.  Patient points out an area to the lateral left lower leg that is tender.  No palpable abnormality.  The calf is soft and pliable.  No tenderness in the popliteal fossa.  Skin:    General: Skin is warm and dry.  Neurological:     Mental Status: She is alert and oriented to person, place, and time.     GCS: GCS eye subscore is 4. GCS verbal subscore is 5. GCS motor subscore is 6.     Coordination: Coordination normal.     ED Results / Procedures / Treatments   Labs (all labs ordered are listed, but only abnormal results are displayed) Labs Reviewed  COMPREHENSIVE METABOLIC PANEL - Abnormal; Notable for the following components:      Result Value   Potassium 3.3 (*)    Glucose, Bld 117 (*)    Calcium 8.7 (*)    All other components within normal limits  URINALYSIS, ROUTINE W REFLEX MICROSCOPIC - Abnormal; Notable for the following components:   APPearance HAZY (*)    Hgb urine dipstick LARGE (*)    Protein, ur 30 (*)    Leukocytes,Ua MODERATE (*)    RBC / HPF >50 (*)    WBC, UA >50 (*)    Bacteria, UA FEW (*)    All other components within normal limits  WET PREP, GENITAL  URINE CULTURE  CBC  GC/CHLAMYDIA PROBE AMP () NOT AT [facility]    EKG None  Radiology No results found.  Procedures Procedures   Medications Ordered in ED Medications  cefTRIAXone (ROCEPHIN) 1 g in sodium chloride 0.9 % 100 mL IVPB (has no administration in time range)    ED Course  I have reviewed the triage vital signs and the nursing notes.  Pertinent labs & imaging results that were available during my care of the patient were reviewed by me and considered in my medical decision making (see chart for details).    MDM Rules/Calculators/A&P                          Patient presents as  outlined with vaginal bleeding.  At this time, bleeding is not heavy.  Exam shows thin brownish-red blood in the vault without active bleeding.  Blood counts are stable without signs of symptomatic anemia.  Pelvic ultrasound is pending.  At this time patient can follow-up with GYN for further evaluation.  Dr. Laverta Baltimore to review ultrasound results for final disposition.  Patient also incidentally appears to have UTI.  Will treat empirically with Keflex. Final Clinical Impression(s) / ED Diagnoses Final diagnoses:  Dysfunctional uterine bleeding    Rx / DC Orders ED Discharge Orders    None       Charlesetta Shanks, MD 03/10/21 1559

## 2021-03-11 ENCOUNTER — Other Ambulatory Visit: Payer: Self-pay

## 2021-03-11 LAB — GC/CHLAMYDIA PROBE AMP (~~LOC~~) NOT AT ARMC
Chlamydia: NEGATIVE
Comment: NEGATIVE
Comment: NORMAL
Neisseria Gonorrhea: NEGATIVE

## 2021-03-12 LAB — URINE CULTURE: Culture: >=100000 — AB

## 2021-03-13 ENCOUNTER — Telehealth: Payer: Self-pay | Admitting: Emergency Medicine

## 2021-03-13 NOTE — Telephone Encounter (Signed)
Post ED Visit - Positive Culture Follow-up  Culture report reviewed by antimicrobial stewardship pharmacist: [place] Team []  Elenor Quinones, Pharm.D. []  Heide Guile, Pharm.D., BCPS AQ-ID []  Parks Neptune, Pharm.D., BCPS []  Alycia Rossetti, Pharm.D., BCPS []  [place], Pharm.D., BCPS, AAHIVP []  Legrand Como, Pharm.D., BCPS, AAHIVP []  Salome Arnt, PharmD, BCPS []  Johnnette Gourd, PharmD, BCPS []  Hughes Better, PharmD, BCPS []  Leeroy Cha, PharmD []  Laqueta Linden, PharmD, BCPS [x]  Albertina Parr, PharmD  [place] Team []  Leodis Sias, PharmD []  Lindell Spar, PharmD []  Royetta Asal, PharmD []  Graylin Shiver, Rph []  Rema Fendt) Glennon Mac, PharmD []  Arlyn Dunning, PharmD []  Netta Cedars, PharmD []  Dia Sitter, PharmD []  Leone Haven, PharmD []  Gretta Arab, PharmD []  Theodis Shove, PharmD []  Peggyann Juba, PharmD []  Reuel Boom, PharmD   Positive urine culture Treated with Cephalexin, organism sensitive to the same and no further patient follow-up is required at this time.  [place] 03/13/2021, 3:22 PM

## 2021-07-07 ENCOUNTER — Encounter (HOSPITAL_COMMUNITY): Payer: Self-pay | Admitting: Emergency Medicine

## 2021-07-07 ENCOUNTER — Emergency Department (HOSPITAL_COMMUNITY): Payer: Medicare Other

## 2021-07-07 ENCOUNTER — Ambulatory Visit (HOSPITAL_COMMUNITY)
Admission: EM | Admit: 2021-07-07 | Discharge: 2021-07-07 | Disposition: A | Discharge status: Home / Self Care | Payer: Self-pay

## 2021-07-07 ENCOUNTER — Other Ambulatory Visit: Payer: Self-pay

## 2021-07-07 ENCOUNTER — Emergency Department (HOSPITAL_COMMUNITY)
Admission: EM | Admit: 2021-07-07 | Discharge: 2021-07-08 | Disposition: A | Discharge status: Home / Self Care | Payer: Medicare Other | Attending: Emergency Medicine | Admitting: Emergency Medicine

## 2021-07-07 DIAGNOSIS — R42 Dizziness and giddiness: Secondary | ICD-10-CM | POA: Diagnosis present

## 2021-07-07 DIAGNOSIS — R791 Abnormal coagulation profile: Secondary | ICD-10-CM | POA: Insufficient documentation

## 2021-07-07 DIAGNOSIS — Z79899 Other long term (current) drug therapy: Secondary | ICD-10-CM | POA: Diagnosis not present

## 2021-07-07 DIAGNOSIS — R519 Headache, unspecified: Secondary | ICD-10-CM | POA: Insufficient documentation

## 2021-07-07 DIAGNOSIS — I10 Essential (primary) hypertension: Secondary | ICD-10-CM | POA: Diagnosis not present

## 2021-07-07 DIAGNOSIS — J45909 Unspecified asthma, uncomplicated: Secondary | ICD-10-CM | POA: Diagnosis not present

## 2021-07-07 DIAGNOSIS — R200 Anesthesia of skin: Secondary | ICD-10-CM

## 2021-07-07 DIAGNOSIS — R202 Paresthesia of skin: Secondary | ICD-10-CM | POA: Insufficient documentation

## 2021-07-07 LAB — DIFFERENTIAL
Abs Immature Granulocytes: 0.09 10*3/uL — ABNORMAL HIGH (ref 0.00–0.07)
Basophils Absolute: 0.1 10*3/uL (ref 0.0–0.1)
Basophils Relative: 1 %
Eosinophils Absolute: 0.1 10*3/uL (ref 0.0–0.5)
Eosinophils Relative: 2 %
Immature Granulocytes: 1 %
Lymphocytes Relative: 34 %
Lymphs Abs: 2.8 10*3/uL (ref 0.7–4.0)
Monocytes Absolute: 0.6 10*3/uL (ref 0.1–1.0)
Monocytes Relative: 8 %
Neutro Abs: 4.6 10*3/uL (ref 1.7–7.7)
Neutrophils Relative %: 54 %

## 2021-07-07 LAB — COMPREHENSIVE METABOLIC PANEL
ALT: 26 U/L (ref 0–44)
AST: 20 U/L (ref 15–41)
Albumin: 3.4 g/dL — ABNORMAL LOW (ref 3.5–5.0)
Alkaline Phosphatase: 65 U/L (ref 38–126)
Anion gap: 10 (ref 5–15)
BUN: 17 mg/dL (ref 8–23)
CO2: 28 mmol/L (ref 22–32)
Calcium: 8.7 mg/dL — ABNORMAL LOW (ref 8.9–10.3)
Chloride: 102 mmol/L (ref 98–111)
Creatinine, Ser: 1.12 mg/dL — ABNORMAL HIGH (ref 0.44–1.00)
GFR, Estimated: 54 mL/min — ABNORMAL LOW (ref >60)
Glucose, Bld: 122 mg/dL — ABNORMAL HIGH (ref 70–99)
Potassium: 3.4 mmol/L — ABNORMAL LOW (ref 3.5–5.1)
Sodium: 140 mmol/L (ref 135–145)
Total Bilirubin: 0.7 mg/dL (ref 0.3–1.2)
Total Protein: 6.4 g/dL — ABNORMAL LOW (ref 6.5–8.1)

## 2021-07-07 LAB — PROTIME-INR
INR: 1 (ref 0.8–1.2)
Prothrombin Time: 12.8 seconds (ref 11.4–15.2)

## 2021-07-07 LAB — I-STAT BETA HCG BLOOD, ED (MC, WL, AP ONLY): I-stat hCG, quantitative: <5 m[IU]/mL (ref <5)

## 2021-07-07 LAB — CBC
HCT: 38.8 % (ref 36.0–46.0)
Hemoglobin: 12.1 g/dL (ref 12.0–15.0)
MCH: 30 pg (ref 26.0–34.0)
MCHC: 31.2 g/dL (ref 30.0–36.0)
MCV: 96 fL (ref 80.0–100.0)
Platelets: 233 10*3/uL (ref 150–400)
RBC: 4.04 MIL/uL (ref 3.87–5.11)
RDW: 12.7 % (ref 11.5–15.5)
WBC: 8.3 10*3/uL (ref 4.0–10.5)
nRBC: 0 % (ref 0.0–0.2)

## 2021-07-07 LAB — APTT: aPTT: 32 seconds (ref 24–36)

## 2021-07-07 LAB — CBG MONITORING, ED: Glucose-Capillary: 96 mg/dL (ref 70–99)

## 2021-07-07 MED ORDER — SODIUM CHLORIDE 0.9% FLUSH
3.0000 mL | Freq: Once | INTRAVENOUS | Status: AC
  Administered 2021-07-07: 3 mL via INTRAVENOUS

## 2021-07-07 MED ORDER — ACETAMINOPHEN 325 MG PO TABS
650.0000 mg | ORAL_TABLET | Freq: Four times a day (QID) | ORAL | 0 refills | Status: AC | PRN
  Filled 2021-07-07: qty 30, 4d supply, fill #0

## 2021-07-07 MED ORDER — LORAZEPAM 2 MG/ML IJ SOLN
1.0000 mg | Freq: Once | INTRAMUSCULAR | Status: AC
  Administered 2021-07-07: 1 mg via INTRAVENOUS
  Filled 2021-07-07: qty 1

## 2021-07-07 MED ORDER — CETIRIZINE HCL 10 MG PO TABS
10.0000 mg | ORAL_TABLET | Freq: Every day | ORAL | 0 refills | Status: DC
  Filled 2021-07-07: qty 10, 10d supply, fill #0

## 2021-07-07 NOTE — ED Provider Notes (Signed)
[place] EMERGENCY DEPARTMENT Provider Note   CSN: [id number] Arrival date & time: 07/07/21  1539     History No chief complaint on file.   Mary Hubbard is a 65 y.o. female.  HPI Patient presents with a daughter-in-law who is helpful in the history, but most details are obtained with the assistance of a translator device. [name]. She was generally in her usual state of health until awakening this morning, and while making breakfast feeling sudden onset dizziness, with heaviness and numbness in the right side of her face.  There was transient blurry vision as well.  Symptoms have improved, and currently her discomfort is minimal.  No weakness in her extremities, no syncope, no fall, no vomiting.  Symptoms are more profound than any she has experienced previously and she presents for evaluation.     Past Medical History:  Diagnosis Date   Asthma    Hypertension    Joint pain     Patient Active Problem List   Diagnosis Date Noted   Asthma due to seasonal allergies 04/20/2014   HTN (hypertension) 04/20/2014   Preventative health care 04/20/2014    Past Surgical History:  Procedure Laterality Date   APPENDECTOMY     20 years ago     OB History     Gravida  2   Para      Term      Preterm      AB      Living  2      SAB      IAB      Ectopic      Multiple      Live Births              No family history on file.  Social History   Tobacco Use   Smoking status: Every Day    Types: Cigarettes   Smokeless tobacco: Never  Substance Use Topics   Alcohol use: No   Drug use: No    Home Medications Prior to Admission medications   Medication Sig Start Date End Date Taking? Authorizing Provider  albuterol (PROVENTIL HFA;VENTOLIN HFA) 108 (90 Base) MCG/ACT inhaler Inhale 2 puffs into the lungs every 6 (six) hours as needed for wheezing or shortness of breath. 12/14/16   Robyn Haber, MD  amLODipine (NORVASC) 5 MG  tablet Take 1 tablet (5 mg total) by mouth daily. For blood pressure 12/14/16   Robyn Haber, MD  atorvastatin (LIPITOR) 20 MG tablet Take 1 tablet (20 mg total) by mouth daily. 10/01/15   Lance Bosch, NP  diclofenac sodium (VOLTAREN) 1 % GEL Apply 2 g topically 3 (three) times daily as needed. 09/22/15   Lance Bosch, NP    Allergies    Patient has no known allergies.  Review of Systems   Review of Systems  Constitutional:        Per HPI, otherwise negative  HENT:         Per HPI, otherwise negative  Respiratory:         Per HPI, otherwise negative  Cardiovascular:        Per HPI, otherwise negative  Gastrointestinal:  Negative for vomiting.  Endocrine:       Negative aside from HPI  Genitourinary:        Neg aside from HPI   Musculoskeletal:        Per HPI, otherwise negative  Skin: Negative.   Neurological:  Negative for  syncope.   Physical Exam Updated Vital Signs BP (!) 162/88   Pulse 68   Temp 98.1 F (36.7 C) (Oral)   Resp (!) 21   Ht '5\' 2"'$  (1.575 m)   Wt 89.8 kg   SpO2 96%   BMI 36.21 kg/m   Physical Exam Vitals and nursing note reviewed.  Constitutional:      General: She is not in acute distress.    Appearance: She is well-developed.  HENT:     Head: Normocephalic and atraumatic.  Eyes:     Conjunctiva/sclera: Conjunctivae normal.  Cardiovascular:     Rate and Rhythm: Normal rate and regular rhythm.  Pulmonary:     Effort: Pulmonary effort is normal. No respiratory distress.     Breath sounds: Normal breath sounds. No stridor.  Abdominal:     General: There is no distension.  Skin:    General: Skin is warm and dry.  Neurological:     Mental Status: She is alert and oriented to person, place, and time.     Cranial Nerves: Cranial nerves are intact. No cranial nerve deficit.     Motor: Motor function is intact.     Coordination: Coordination is intact.    ED Results / Procedures / Treatments   Labs (all labs ordered are listed, but  only abnormal results are displayed) Labs Reviewed  DIFFERENTIAL - Abnormal; Notable for the following components:      Result Value   Abs Immature Granulocytes 0.09 (*)    All other components within normal limits  COMPREHENSIVE METABOLIC PANEL - Abnormal; Notable for the following components:   Potassium 3.4 (*)    Glucose, Bld 122 (*)    Creatinine, Ser 1.12 (*)    Calcium 8.7 (*)    Total Protein 6.4 (*)    Albumin 3.4 (*)    GFR, Estimated 54 (*)    All other components within normal limits  PROTIME-INR  APTT  CBC  I-STAT CHEM 8, ED  CBG MONITORING, ED  I-STAT BETA HCG BLOOD, ED ([facility], [facility], [facility] ONLY)    EKG None  Radiology CT HEAD WO CONTRAST  Result Date: 07/07/2021 CLINICAL DATA:  Neurologic deficit.  Evaluate for stroke. EXAM: CT HEAD WITHOUT CONTRAST TECHNIQUE: Contiguous axial images were obtained from the base of the skull through the vertex without intravenous contrast. COMPARISON:  None. FINDINGS: Brain: No evidence of acute infarction, hemorrhage, hydrocephalus, extra-axial collection or mass lesion/mass. There is mild diffuse low-attenuation within the subcortical and periventricular white matter compatible with chronic microvascular disease. Chronic appearing left posterior basal ganglia lacunar infarct noted, image 11/3. Vascular: No abnormal or unexpected calcifications identified. Skull: Normal. Negative for fracture or focal lesion. Sinuses/Orbits: No acute finding. Other: None. IMPRESSION: 1. No acute intracranial abnormalities. 2. Chronic small vessel ischemic change. Electronically Signed   By: Kerby Moors M.D.   On: 07/07/2021 16:57   MR BRAIN WO CONTRAST  Result Date: 07/07/2021 CLINICAL DATA:  Initial evaluation for neuro deficit, stroke suspected. EXAM: MRI HEAD WITHOUT CONTRAST TECHNIQUE: Multiplanar, multiecho pulse sequences of the brain and surrounding structures were obtained without intravenous contrast. COMPARISON:  CT from earlier the same day.  FINDINGS: Brain: Cerebral volume within normal limits for age. Scattered patchy T2/FLAIR hyperintensity involving the periventricular and deep white matter both cerebral hemispheres most consistent with chronic small vessel ischemic disease, moderate in nature. No abnormal foci of restricted diffusion to suggest acute or subacute ischemia. Gray-white matter differentiation maintained. No areas of remote cortical  infarction. No evidence for acute or chronic intracranial hemorrhage. 1.7 x 1.4 x 1.4 cm extra-axial mass overlying the parasagittal left parietal convexity consistent with a meningioma (series 5, image 20). No associated edema or regional mass effect. Lesion abuts the adjacent superior sagittal sinus without visible sinus invasion. No other mass lesion, mass effect or midline shift. No hydrocephalus or extra-axial fluid collection. Pituitary gland suprasellar region within normal limits. Midline structures intact. Vascular: Major intracranial vascular flow voids are maintained. ICAs appear somewhat dolichoectatic about the siphons. Skull and upper cervical spine: Craniocervical junction within normal limits. Bone marrow signal intensity normal. No scalp soft tissue abnormality. Sinuses/Orbits: Globes and orbital soft tissues within normal limits. Scattered mucosal thickening noted throughout the paranasal sinuses. Small air-fluid level noted within the right maxillary sinus, suggesting acute sinusitis. Trace right mastoid effusion noted, of doubtful significance. Inner ear structures grossly normal. Other: None. IMPRESSION: 1. No acute intracranial abnormality. 2. 1.7 cm meningioma overlying the parasagittal left parietal convexity without associated edema or regional mass effect. 3. Underlying moderate chronic microvascular ischemic disease. 4. Acute right maxillary sinusitis. Electronically Signed   By: Jeannine Boga M.D.   On: 07/07/2021 23:27    Procedures Procedures   Medications Ordered in  ED Medications  sodium chloride flush (NS) 0.9 % injection 3 mL (3 mLs Intravenous Given 07/07/21 2130)  LORazepam (ATIVAN) injection 1 mg (1 mg Intravenous Given 07/07/21 2129)    ED Course  I have reviewed the triage vital signs and the nursing notes.  Pertinent labs & imaging results that were available during my care of the patient were reviewed by me and considered in my medical decision making (see chart for details). 11:47 PM Patient resting comfortably.  I have conveyed the results to family who note that they are comfortable discussing them with the patient.  Absent distress, no new complaints, no hemodynamic instability, no evidence for stroke, intracranial hemorrhage, patient has no fever, no leukocytosis, low suspicion for bacteremia, sepsis, symptoms may be secondary to acute sinusitis.  Patient started on appropriate medication, can follow-up with primary care.   MDM Rules/Calculators/A&P MDM Number of Diagnoses or Management Options Bad headache: new, needed workup   Amount and/or Complexity of Data Reviewed Clinical lab tests: ordered and reviewed Tests in the radiology section of CPT: ordered and reviewed Tests in the medicine section of CPT: reviewed and ordered Decide to obtain previous medical records or to obtain history from someone other than the patient: yes Obtain history from someone other than the patient: yes Review and summarize past medical records: yes Independent visualization of images, tracings, or specimens: yes  Risk of Complications, Morbidity, and/or Mortality Presenting problems: high Diagnostic procedures: high Management options: high  Critical Care Total time providing critical care: < 30 minutes  Patient Progress Patient progress: stable   Final Clinical Impression(s) / ED Diagnoses Final diagnoses:  Bad headache  Numbness    Rx / DC Orders ED Discharge Orders          Ordered    cetirizine (ZYRTEC ALLERGY) 10 MG tablet  Daily         07/07/21 2350    acetaminophen (TYLENOL) 325 MG tablet  Every 6 hours PRN        07/07/21 2350             Carmin Muskrat, MD 07/07/21 2351

## 2021-07-07 NOTE — ED Triage Notes (Signed)
Pt woke up fine this morning then when was cooking around 8am started having headache, dizziness, blurred vision. Pt having posterior head pains. Pt having heavy vaginal bleeding since May that is intermittent and will last for several days.

## 2021-07-07 NOTE — ED Notes (Signed)
Patient back from MRI.

## 2021-07-07 NOTE — ED Notes (Signed)
Patient is being discharged from the Urgent Care and sent to the Emergency Department via POV . Per Wells Guiles, [place], patient is in need of higher level of care due to Headache, blurred vision, dizziness and high blood pressure. Patient is aware and verbalizes understanding of plan of care.  Vitals:   07/07/21 1522 07/07/21 1524  BP: (!) 181/104 (!) 180/97  Pulse: 86   Resp: 19

## 2021-07-07 NOTE — ED Triage Notes (Signed)
c/o rightsided numbness, headache, visual disturbance started this morning around 8 am; denies PMH.

## 2021-07-07 NOTE — ED Provider Notes (Signed)
Emergency Medicine Provider Triage Evaluation Note  Mary Hubbard , a 65 y.o. female  was evaluated in triage.  Pt complains of right sided numbness, headache, and blurry vision. Last known normal at 8am. Hx of similar symptoms but today pain and numbness were worse. Has not taken anything for symptoms. No PMH.  Review of Systems  Positive: Headache, visual disturbance, numbness Negative: Weakness, falls, neck pain, back pain  Physical Exam  Ht '5\' 2"'$  (1.575 m)   Wt 89.8 kg   BMI 36.21 kg/m  Gen:   Awake, no distress   Resp:  Normal effort  MSK:   Moves extremities without difficulty  Other:  5/5 strength in bilateral upper and lower extremities, no facial droop, no slurring speech, change in sensation on right arm with tingling  Medical Decision Making  Medically screening exam initiated at 4:02 PM.  Appropriate orders placed.  Mary Hubbard was informed that the remainder of the evaluation will be completed by another provider, this initial triage assessment does not replace that evaluation, and the importance of remaining in the ED until their evaluation is complete.     Estill Cotta 07/07/21 1604    Tegeler, Gwenyth Allegra, MD 07/07/21 [phone]

## 2021-07-07 NOTE — ED Notes (Signed)
Made Wells Guiles PA aware of pt's complaint, blood pressure readings. Coming to see patient in triage.

## 2021-07-07 NOTE — ED Notes (Signed)
Patient transported to MRI 

## 2021-07-08 ENCOUNTER — Other Ambulatory Visit: Payer: Self-pay

## 2021-07-14 ENCOUNTER — Encounter (HOSPITAL_COMMUNITY): Payer: Self-pay | Admitting: Emergency Medicine

## 2021-08-04 ENCOUNTER — Other Ambulatory Visit: Payer: Self-pay | Admitting: Adult Medicine

## 2021-08-04 DIAGNOSIS — Z780 Asymptomatic menopausal state: Secondary | ICD-10-CM

## 2021-08-04 DIAGNOSIS — Z1231 Encounter for screening mammogram for malignant neoplasm of breast: Secondary | ICD-10-CM

## 2021-09-26 ENCOUNTER — Ambulatory Visit
Admission: RE | Admit: 2021-09-26 | Discharge: 2021-09-26 | Disposition: A | Discharge status: Home / Self Care | Payer: Medicare Other | Source: Ambulatory Visit | Attending: Adult Medicine | Admitting: Adult Medicine

## 2021-09-26 ENCOUNTER — Other Ambulatory Visit: Payer: Self-pay

## 2021-09-26 DIAGNOSIS — Z1231 Encounter for screening mammogram for malignant neoplasm of breast: Secondary | ICD-10-CM

## 2021-09-29 ENCOUNTER — Ambulatory Visit (INDEPENDENT_AMBULATORY_CARE_PROVIDER_SITE_OTHER): Payer: Medicare Other | Admitting: Obstetrics and Gynecology

## 2021-09-29 ENCOUNTER — Other Ambulatory Visit: Payer: Self-pay

## 2021-09-29 ENCOUNTER — Other Ambulatory Visit (HOSPITAL_COMMUNITY)
Admission: RE | Admit: 2021-09-29 | Discharge: 2021-09-29 | Disposition: A | Discharge status: Home / Self Care | Payer: Medicare Other | Source: Ambulatory Visit | Attending: Obstetrics and Gynecology | Admitting: Obstetrics and Gynecology

## 2021-09-29 ENCOUNTER — Encounter: Payer: Self-pay | Admitting: Obstetrics and Gynecology

## 2021-09-29 VITALS — BP 146/100 | HR 105 | Ht 62.0 in | Wt 199.9 lb

## 2021-09-29 DIAGNOSIS — Z1211 Encounter for screening for malignant neoplasm of colon: Secondary | ICD-10-CM | POA: Diagnosis not present

## 2021-09-29 DIAGNOSIS — Z124 Encounter for screening for malignant neoplasm of cervix: Secondary | ICD-10-CM | POA: Insufficient documentation

## 2021-09-29 DIAGNOSIS — N950 Postmenopausal bleeding: Secondary | ICD-10-CM

## 2021-09-29 DIAGNOSIS — Z01419 Encounter for gynecological examination (general) (routine) without abnormal findings: Secondary | ICD-10-CM | POA: Insufficient documentation

## 2021-09-29 DIAGNOSIS — N8502 Endometrial intraepithelial neoplasia [EIN]: Secondary | ICD-10-CM | POA: Diagnosis not present

## 2021-09-29 DIAGNOSIS — Z1151 Encounter for screening for human papillomavirus (HPV): Secondary | ICD-10-CM | POA: Diagnosis not present

## 2021-09-29 NOTE — Progress Notes (Signed)
65 yo P2 with BMI 36 postmenopausal presenting for the evaluation of vaginal bleeding. Patient accompanied by a friend who is acting as a Optometrist. Patient describes daily vaginal bleeding and yellow discharge. At times this bleeding is heavier but she never requires the use of a pad. The bleeding is noticed when she wipes. She denies any pelvic pain. She states that this bleeding has been present before 2008. She denies every going 12 months without vaginal bleeding. Patient is not sexually active. She denies urinary incontinence. Patient is without any other complaints and was recently seen by PCP. Patient has not had a GYN exam in decades  Past Medical History:  Diagnosis Date   Asthma    Hypertension    Joint pain    Past Surgical History:  Procedure Laterality Date   APPENDECTOMY     20 years ago   History reviewed. No pertinent family history. Social History   Tobacco Use   Smoking status: Every Day    Types: Cigarettes   Smokeless tobacco: Never  Substance Use Topics   Alcohol use: No   Drug use: No   ROS See pertinent in HPI. All other systems reviewed and non contributory Blood pressure (!) 146/100, pulse (!) 105, height 5\' 2"  (1.575 m), weight 199 lb 14.4 oz (90.7 kg). GENERAL: Well-developed, well-nourished female in no acute distress.  ABDOMEN: Soft, nontender, nondistended. No organomegaly. PELVIC: Normal external female genitalia. Vagina is pink and rugated.  Normal discharge. Normal appearing cervix, very friable. Uterus is normal in size. No adnexal mass or tenderness. Chaperone present during the pelvic exam EXTREMITIES: No cyanosis, clubbing, or edema, 2+ distal pulses.  A/P 65 yo with postmenopausal vaginal bleeding - pelvic ultrasound ordered - pap smear ordered as patient has not had one in 10 years - discussed benefits of endometrial biopsy ENDOMETRIAL BIOPSY     The indications for endometrial biopsy were reviewed.   Risks of the biopsy including  cramping, bleeding, infection, uterine perforation, inadequate specimen and need for additional procedures  were discussed. The patient states she understands and agrees to undergo procedure today. Consent was signed. Time out was performed. Urine HCG was negative. A sterile speculum was placed in the patient's vagina and the cervix was prepped with Betadine. A single-toothed tenaculum was placed on the anterior lip of the cervix to stabilize it. The uterine cavity was sounded to a depth of 9 cm using the uterine sound. The 3 mm pipelle was introduced into the endometrial cavity without difficulty, 2 passes were made.  A  moderate amount of tissue was  sent to pathology. The instruments were removed from the patient's vagina. Minimal bleeding from the cervix was noted. The patient tolerated the procedure well.  Routine post-procedure instructions were given to the patient. The patient will follow up in two weeks to review the results and for further management.  - Patient will be contacted with results - Briefly discussed possible outcomes and management plan with patient. She verbalized understanding and wishes to receive information over the phone as transportation is an issue for her

## 2021-09-29 NOTE — Progress Notes (Signed)
New GYN, Establish Care ED Visit May for AUB Bleeding continues daily, sometimes heavy, sometimes light

## 2021-09-30 ENCOUNTER — Ambulatory Visit (HOSPITAL_BASED_OUTPATIENT_CLINIC_OR_DEPARTMENT_OTHER)
Admission: RE | Admit: 2021-09-30 | Discharge: 2021-09-30 | Disposition: A | Discharge status: Home / Self Care | Payer: Medicare Other | Source: Ambulatory Visit | Attending: Obstetrics and Gynecology | Admitting: Obstetrics and Gynecology

## 2021-09-30 DIAGNOSIS — N950 Postmenopausal bleeding: Secondary | ICD-10-CM | POA: Insufficient documentation

## 2021-09-30 LAB — CBC
Hematocrit: 40.2 % (ref 34.0–46.6)
Hemoglobin: 13.1 g/dL (ref 11.1–15.9)
MCH: 29.3 pg (ref 26.6–33.0)
MCHC: 32.6 g/dL (ref 31.5–35.7)
MCV: 90 fL (ref 79–97)
Platelets: 254 10*3/uL (ref 150–450)
RBC: 4.47 x10E6/uL (ref 3.77–5.28)
RDW: 12.2 % (ref 11.7–15.4)
WBC: 8.2 10*3/uL (ref 3.4–10.8)

## 2021-09-30 LAB — FOLLICLE STIMULATING HORMONE: FSH: 26.3 m[IU]/mL

## 2021-09-30 LAB — LUTEINIZING HORMONE: LH: 34 m[IU]/mL

## 2021-10-03 LAB — CYTOLOGY - PAP
Comment: NEGATIVE
Diagnosis: NEGATIVE
High risk HPV: NEGATIVE

## 2021-10-03 LAB — SURGICAL PATHOLOGY

## 2021-10-03 NOTE — Addendum Note (Signed)
Addended by: Mora Bellman on: 10/03/2021 01:56 PM   Modules accepted: Orders

## 2021-10-12 ENCOUNTER — Ambulatory Visit (HOSPITAL_BASED_OUTPATIENT_CLINIC_OR_DEPARTMENT_OTHER)
Admission: RE | Admit: 2021-10-12 | Discharge: 2021-10-12 | Disposition: A | Discharge status: Home / Self Care | Payer: Medicare Other | Source: Ambulatory Visit | Attending: Adult Medicine | Admitting: Adult Medicine

## 2021-10-12 ENCOUNTER — Other Ambulatory Visit: Payer: Self-pay

## 2021-10-12 DIAGNOSIS — Z780 Asymptomatic menopausal state: Secondary | ICD-10-CM | POA: Insufficient documentation

## 2021-10-12 DIAGNOSIS — Z1231 Encounter for screening mammogram for malignant neoplasm of breast: Secondary | ICD-10-CM | POA: Insufficient documentation

## 2021-10-16 NOTE — Progress Notes (Signed)
GYNECOLOGIC ONCOLOGY NEW PATIENT CONSULTATION   Patient Name: Mary Hubbard  Patient Age: 65 y.o. Date of Service: 10/17/2021 Referring Provider: Mora Bellman, MD [employer],  [place] [address]   Primary Care Provider: Pcp, No Consulting Provider: Jeral Pinch, MD   Assessment/Plan:  Postmenopausal patient with at least CAH.   We reviewed the diagnosis of complex atypical hyperplasia (CAH) and the treatment options, including medical management (Mirena IUD or progesterone PO) or hysterectomy.  Given inability to rule out cancer on biopsy, I recommend surgery.  Patient desires to proceed with surgical management.    The patient is a suitable candidate for hysterectomy via a minimally invasive approach to surgery.  Given that she is postmenopausal, a bilateral salpingo-oophorectomy is also recommended.  We reviewed that robotic assistance would be used to complete the surgery.  We discussed that endometrial cancer is detected in about 40% of final uterine pathology specimens from patients with CAH.  We discussed two options at the time of surgery. One is to send the uterus for intraoperative frozen pathology.  If cancer is identified at the time of surgery, additional procedures including lymph node evaluation, is recommended. The other option would be to proceed with sentinel lymph node injection and biopsy, realizing that this is over treatment if no cancer is ultimately diagnosed after her hysterectomy.   We reviewed the sentinel lymph node technique. Risks and benefits of sentinel lymph node biopsy was reviewed. We reviewed the technique and ICG dye. The patient DOES NOT have an iodine allergy or known liver dysfunction. We reviewed the false negative rate (0.4%), and that 3% of patients with metastatic disease will not have it detected by SLN biopsy in endometrial cancer. A low risk of allergic reaction to the dye, <0.2% for ICG, has been reported. We also discussed that  in the case of failed mapping, which occurs 40% of the time, a bilateral or unilateral lymphadenectomy will be performed at the surgeons discretion.   Potential benefits of sentinel nodes including a higher detection rate for metastasis due to ultrastaging and potential reduction in operative morbidity. However, there remains uncertainty as to the role for treatment of micrometastatic disease. Further, the benefit of operative morbidity associated with the SLN technique in endometrial cancer is not yet completely known. In other patient populations (e.g. the cervical cancer population) there has been observed reductions in morbidity with SLN biopsy compared to pelvic lymphadenectomy. Lymphedema, nerve dysfunction and lymphocysts are all potential risks with the SLN technique as with complete lymphadenectomy. Additional risks to the patient include the risk of damage to an internal organ while operating in an altered view (e.g. the black and white image of the robotic fluorescence imaging mode).   After our discussion, the patient voiced wishing to proceed with SLN biopsy.  We discussed plan for a robotic assisted hysterectomy, bilateral salpingo-oophorectomy, sentinel lymph node evaluation, possible lymph node dissection, possible laparotomy. The risks of surgery were discussed in detail and she understands these to include infection; wound separation; hernia; vaginal cuff separation, injury to adjacent organs such as bowel, bladder, blood vessels, ureters and nerves; bleeding which may require blood transfusion; anesthesia risk; thromboembolic events; possible death; unforeseen complications; possible need for re-exploration; medical complications such as heart attack, stroke, pleural effusion and pneumonia; and, if full lymphadenectomy is performed the risk of lymphedema and lymphocyst. The patient will receive DVT and antibiotic prophylaxis as indicated. She voiced a clear understanding. She had the  opportunity to ask questions. Perioperative  instructions were reviewed with her. Prescriptions for post-op medications were sent to her pharmacy of choice.  I will call her with cervical biopsy results. I suspect findings represent a nabothian cyst (albeit somewhat atypical in appearance).   Blood pressure is significantly elevated. She is asymptomatic. Will have office send request for pre-surgical evaluation and optimization to her PCP at [facility].    A copy of this note was sent to the patient's referring provider.   85 minutes of total time was spent for this patient encounter, including preparation, face-to-face counseling with the patient and coordination of care, and documentation of the encounter.   Jeral Pinch, MD  Division of Gynecologic Oncology  Department of Obstetrics and Gynecology  [facility]  ___________________________________________  Chief Complaint: Chief Complaint  Patient presents with   Atypical endometrial hyperplasia    History of Present Illness:  Mary Hubbard is a 65 y.o. y.o. female who is seen in consultation at the request of Constant, Peggy, MD for an evaluation of at least complex atypical hyperplasia.  Patient was seen recently for a long history of vaginal bleeding and discharge.  Biopsy was done on 12/1 showing at least atypical endometrial hyperplasia.  Ultrasound performed the day after her biopsy showed thickened heterogenous endometrial lining.  Today, the patient reports that she thinks her menses changed from regular monthly menses in 2018 or 2019.  Since that time, she reports small amount of vaginal bleeding daily.  She describes this as a little blood when she wipes after urinating.  She endorses a good appetite without nausea or emesis.  She notes some constipation the last couple of days but this is at baseline for her and is somewhat diet dependent.  She denies any urinary symptoms.  She denies any  abdominal or pelvic pain.  Patient lives in [place] with her daughter.  She is not currently working.  She comes in today with her niece.  Video translator was used for today's visit.  PAST MEDICAL HISTORY:  Past Medical History:  Diagnosis Date   Asthma    denies hosp or intubations   BMI 36.0-36.9,adult    Hypertension    Joint pain      PAST SURGICAL HISTORY:  Past Surgical History:  Procedure Laterality Date   APPENDECTOMY     20 years ago    OB/GYN HISTORY:  OB History  Gravida Para Term Preterm AB Living  2 0 0 0 0    SAB IAB Ectopic Multiple Live Births  0 0 0        # Outcome Date GA Lbr Len/2nd Weight Sex Delivery Anes PTL Lv  2 Gravida           1 Gravida             No LMP recorded (lmp unknown). Patient is postmenopausal.  Age at menarche: 36 Age at menopause: See HPI Hx of HRT: Denies Hx of STDs: Denies Last pap: 09/29/21 - negative, HR HPV negative; reports not having seen a gynecologist in many years but did have Pap smears at 1 point previously that were normal History of abnormal pap smears: Denies  SCREENING STUDIES:  Last mammogram: 09/2021  Last colonoscopy: 2015 Last bone mineral density: 09/2021  MEDICATIONS: Outpatient Encounter Medications as of 10/17/2021  Medication Sig   albuterol (PROVENTIL HFA;VENTOLIN HFA) 108 (90 Base) MCG/ACT inhaler Inhale 2 puffs into the lungs every 6 (six) hours as needed for wheezing or shortness of breath.  amLODipine (NORVASC) 5 MG tablet Take 1 tablet (5 mg total) by mouth daily. For blood pressure   atorvastatin (LIPITOR) 20 MG tablet Take 1 tablet (20 mg total) by mouth daily.   atorvastatin (LIPITOR) 40 MG tablet Take 40 mg by mouth at bedtime.   carvedilol (COREG) 3.125 MG tablet Take 3.125 mg by mouth daily.   fluticasone furoate-vilanterol (BREO ELLIPTA) 100-25 MCG/ACT AEPB Inhale 1 puff into the lungs daily.   Vitamin D, Ergocalciferol, (DRISDOL) 1.25 MG (50000 UNIT) CAPS capsule Take 50,000  Units by mouth once a week.   acetaminophen (TYLENOL) 325 MG tablet Take 2 tablets (650 mg total) by mouth every 6 (six) hours as needed for headache. (Patient not taking: Reported on 10/17/2021)   cetirizine (ZYRTEC ALLERGY) 10 MG tablet Take 1 tablet (10 mg total) by mouth daily for 10 days.   diclofenac sodium (VOLTAREN) 1 % GEL Apply 2 g topically 3 (three) times daily as needed. (Patient not taking: Reported on 10/17/2021)   No facility-administered encounter medications on file as of 10/17/2021.    ALLERGIES:  No Known Allergies   FAMILY HISTORY:  History reviewed. No pertinent family history.   SOCIAL HISTORY:  Social Connections: Not on file    REVIEW OF SYSTEMS:  Denies appetite changes, fevers, chills, fatigue, unexplained weight changes. Denies hearing loss, neck lumps or masses, mouth sores, ringing in ears or voice changes. Denies cough or wheezing.  Denies shortness of breath. Denies chest pain or palpitations. Denies leg swelling. Denies abdominal distention, pain, blood in stools, constipation, diarrhea, nausea, vomiting, or early satiety. Denies pain with intercourse, dysuria, frequency, hematuria or incontinence. Denies hot flashes, pelvic pain.   Denies joint pain, back pain or muscle pain/cramps. Denies itching, rash, or wounds. Denies dizziness, headaches, numbness or seizures. Denies swollen lymph nodes or glands, denies easy bruising or bleeding. Denies anxiety, depression, confusion, or decreased concentration.  Physical Exam:  Vital Signs for this encounter:  Blood pressure (!) 191/116, pulse 94, temperature (!) 97.5 F (36.4 C), temperature source Tympanic, resp. rate 18, height 5\' 2"  (1.575 m), weight 202 lb (91.6 kg), SpO2 99 %. Body mass index is 36.95 kg/m. General: Alert, oriented, no acute distress.  HEENT: Normocephalic, atraumatic. Sclera anicteric.  Chest: Clear to auscultation bilaterally. No wheezes, rhonchi, or rales. Cardiovascular:  Regular rate and rhythm, no murmurs, rubs, or gallops.  Abdomen: Obese. Normoactive bowel sounds. Soft, nondistended, nontender to palpation. No masses or hepatosplenomegaly appreciated. No palpable fluid wave.  Well-healed right lower quadrant scar. Extremities: Grossly normal range of motion. Warm, well perfused. No edema bilaterally.  Skin: No rashes or lesions.  Lymphatics: No cervical, supraclavicular, or inguinal adenopathy.  GU:  Normal external female genitalia.  No lesions. No discharge or bleeding.             Bladder/urethra:  No lesions or masses, well supported bladder             Vagina: Well rugated, no lesions or masses.             Cervix: Normal size, anteriorly there appears to be a nabothian cyst although some atypical vascularity over this area.  Biopsy taken as noted below.             Uterus: 8-10 cm, mobile, no parametrial involvement or nodularity.             Adnexa: No masses appreciated.  Rectal: No nodularity.  Cervical biopsy procedure Preoperative diagnosis: cervical lesion Postoperative diagnosis: same  as above Physician: Berline Lopes MD EBL: minimal Specimen: anterior cervical biopsy Procedure: After procedure was discussed with the patient and verbal consent obtained, a speculum was placed in the vagina and cervix well visualized. The cervix was cleansed with betadine x3. A biopsy of the anterior cervix was taken with Tischler forceps. Hemostasis was noted. All instruments were removed from the vagina. Overall, the patient tolerated the procedure well.  LABORATORY AND RADIOLOGIC DATA:  Outside medical records were reviewed to synthesize the above history, along with the history and physical obtained during the visit.   Lab Results  Component Value Date   WBC 8.2 09/29/2021   HGB 13.1 09/29/2021   HCT 40.2 09/29/2021   PLT 254 09/29/2021   GLUCOSE 122 (H) 07/07/2021   CHOL 204 (H) 09/29/2015   TRIG 153 (H) 09/29/2015   HDL 44 (L) 09/29/2015   LDLCALC 129  09/29/2015   ALT 26 07/07/2021   AST 20 07/07/2021   NA 140 07/07/2021   K 3.4 (L) 07/07/2021   CL 102 07/07/2021   CREATININE 1.12 (H) 07/07/2021   BUN 17 07/07/2021   CO2 28 07/07/2021   TSH 2.218 08/21/2014   INR 1.0 07/07/2021   HGBA1C 4.9 04/20/2014   12/1: A. 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  - See comment.  - Deeper sections examined.   COMMENT:   Sections demonstrate at least atypical endometrial hyperplasia/EIC in a  background of extensive mucinous metaplasia, syncytial papillary change,  and acute inflammation. The findings are concerning for but not  diagnostic of endometrial adenocarcinoma.   Pelvic ultrasound 12/2: FINDINGS: Uterus   Measurements: 9.3 x 5.3 x 6.3 cm = volume: 163 mL. Anteverted. Normal morphology without mass. Nabothian cysts at cervix.   Endometrium   Thickness: 18 mm. Thickened, heterogeneous, with several areas of hypoechogenicity, question related to preceding endometrial biopsy 1 day ago. Largest area of hypoechogenicity measures 13 x 8 x 8 mm. No discrete mass or calcification.   Right ovary   Not visualized, likely obscured by bowel   Left ovary   Measurements: 2.7 x 3.4 x 1.9 cm = volume: 9.0 mL. Normal morphology without mass   Other findings   No free pelvic fluid.  No adnexal masses.

## 2021-10-16 NOTE — H&P (View-Only) (Signed)
GYNECOLOGIC ONCOLOGY NEW PATIENT CONSULTATION   Patient Name: Mary Hubbard  Patient Age: 65 y.o. Date of Service: 10/17/2021 Referring Provider: Mora Bellman, MD [place],  [place] [address]   Primary Care Provider: Pcp, No Consulting Provider: Jeral Pinch, MD   Assessment/Plan:  Postmenopausal patient with at least CAH.   We reviewed the diagnosis of complex atypical hyperplasia (CAH) and the treatment options, including medical management (Mirena IUD or progesterone PO) or hysterectomy.  Given inability to rule out cancer on biopsy, I recommend surgery.  Patient desires to proceed with surgical management.    The patient is a suitable candidate for hysterectomy via a minimally invasive approach to surgery.  Given that she is postmenopausal, a bilateral salpingo-oophorectomy is also recommended.  We reviewed that robotic assistance would be used to complete the surgery.  We discussed that endometrial cancer is detected in about 40% of final uterine pathology specimens from patients with CAH.  We discussed two options at the time of surgery. One is to send the uterus for intraoperative frozen pathology.  If cancer is identified at the time of surgery, additional procedures including lymph node evaluation, is recommended. The other option would be to proceed with sentinel lymph node injection and biopsy, realizing that this is over treatment if no cancer is ultimately diagnosed after her hysterectomy.   We reviewed the sentinel lymph node technique. Risks and benefits of sentinel lymph node biopsy was reviewed. We reviewed the technique and ICG dye. The patient DOES NOT have an iodine allergy or known liver dysfunction. We reviewed the false negative rate (0.4%), and that 3% of patients with metastatic disease will not have it detected by SLN biopsy in endometrial cancer. A low risk of allergic reaction to the dye, <0.2% for ICG, has been reported. We also discussed that  in the case of failed mapping, which occurs 40% of the time, a bilateral or unilateral lymphadenectomy will be performed at the surgeons discretion.   Potential benefits of sentinel nodes including a higher detection rate for metastasis due to ultrastaging and potential reduction in operative morbidity. However, there remains uncertainty as to the role for treatment of micrometastatic disease. Further, the benefit of operative morbidity associated with the SLN technique in endometrial cancer is not yet completely known. In other patient populations (e.g. the cervical cancer population) there has been observed reductions in morbidity with SLN biopsy compared to pelvic lymphadenectomy. Lymphedema, nerve dysfunction and lymphocysts are all potential risks with the SLN technique as with complete lymphadenectomy. Additional risks to the patient include the risk of damage to an internal organ while operating in an altered view (e.g. the black and white image of the robotic fluorescence imaging mode).   After our discussion, the patient voiced wishing to proceed with SLN biopsy.  We discussed plan for a robotic assisted hysterectomy, bilateral salpingo-oophorectomy, sentinel lymph node evaluation, possible lymph node dissection, possible laparotomy. The risks of surgery were discussed in detail and she understands these to include infection; wound separation; hernia; vaginal cuff separation, injury to adjacent organs such as bowel, bladder, blood vessels, ureters and nerves; bleeding which may require blood transfusion; anesthesia risk; thromboembolic events; possible death; unforeseen complications; possible need for re-exploration; medical complications such as heart attack, stroke, pleural effusion and pneumonia; and, if full lymphadenectomy is performed the risk of lymphedema and lymphocyst. The patient will receive DVT and antibiotic prophylaxis as indicated. She voiced a clear understanding. She had the  opportunity to ask questions. Perioperative  instructions were reviewed with her. Prescriptions for post-op medications were sent to her pharmacy of choice.  I will call her with cervical biopsy results. I suspect findings represent a nabothian cyst (albeit somewhat atypical in appearance).   Blood pressure is significantly elevated. She is asymptomatic. Will have office send request for pre-surgical evaluation and optimization to her PCP at [facility].    A copy of this note was sent to the patient's referring provider.   85 minutes of total time was spent for this patient encounter, including preparation, face-to-face counseling with the patient and coordination of care, and documentation of the encounter.   Jeral Pinch, MD  Division of Gynecologic Oncology  Department of Obstetrics and Gynecology  [facility]  ___________________________________________  Chief Complaint: Chief Complaint  Patient presents with   Atypical endometrial hyperplasia    History of Present Illness:  Mary Hubbard is a 65 y.o. y.o. female who is seen in consultation at the request of Constant, Peggy, MD for an evaluation of at least complex atypical hyperplasia.  Patient was seen recently for a long history of vaginal bleeding and discharge.  Biopsy was done on 12/1 showing at least atypical endometrial hyperplasia.  Ultrasound performed the day after her biopsy showed thickened heterogenous endometrial lining.  Today, the patient reports that she thinks her menses changed from regular monthly menses in 2018 or 2019.  Since that time, she reports small amount of vaginal bleeding daily.  She describes this as a little blood when she wipes after urinating.  She endorses a good appetite without nausea or emesis.  She notes some constipation the last couple of days but this is at baseline for her and is somewhat diet dependent.  She denies any urinary symptoms.  She denies any  abdominal or pelvic pain.  Patient lives in [place] with her daughter.  She is not currently working.  She comes in today with her niece.  Video translator was used for today's visit.  PAST MEDICAL HISTORY:  Past Medical History:  Diagnosis Date   Asthma    denies hosp or intubations   BMI 36.0-36.9,adult    Hypertension    Joint pain      PAST SURGICAL HISTORY:  Past Surgical History:  Procedure Laterality Date   APPENDECTOMY     20 years ago    OB/GYN HISTORY:  OB History  Gravida Para Term Preterm AB Living  2 0 0 0 0    SAB IAB Ectopic Multiple Live Births  0 0 0        # Outcome Date GA Lbr Len/2nd Weight Sex Delivery Anes PTL Lv  2 Gravida           1 Gravida             No LMP recorded (lmp unknown). Patient is postmenopausal.  Age at menarche: 63 Age at menopause: See HPI Hx of HRT: Denies Hx of STDs: Denies Last pap: 09/29/21 - negative, HR HPV negative; reports not having seen a gynecologist in many years but did have Pap smears at 1 point previously that were normal History of abnormal pap smears: Denies  SCREENING STUDIES:  Last mammogram: 09/2021  Last colonoscopy: 2015 Last bone mineral density: 09/2021  MEDICATIONS: Outpatient Encounter Medications as of 10/17/2021  Medication Sig   albuterol (PROVENTIL HFA;VENTOLIN HFA) 108 (90 Base) MCG/ACT inhaler Inhale 2 puffs into the lungs every 6 (six) hours as needed for wheezing or shortness of breath.  amLODipine (NORVASC) 5 MG tablet Take 1 tablet (5 mg total) by mouth daily. For blood pressure   atorvastatin (LIPITOR) 20 MG tablet Take 1 tablet (20 mg total) by mouth daily.   atorvastatin (LIPITOR) 40 MG tablet Take 40 mg by mouth at bedtime.   carvedilol (COREG) 3.125 MG tablet Take 3.125 mg by mouth daily.   fluticasone furoate-vilanterol (BREO ELLIPTA) 100-25 MCG/ACT AEPB Inhale 1 puff into the lungs daily.   Vitamin D, Ergocalciferol, (DRISDOL) 1.25 MG (50000 UNIT) CAPS capsule Take 50,000  Units by mouth once a week.   acetaminophen (TYLENOL) 325 MG tablet Take 2 tablets (650 mg total) by mouth every 6 (six) hours as needed for headache. (Patient not taking: Reported on 10/17/2021)   cetirizine (ZYRTEC ALLERGY) 10 MG tablet Take 1 tablet (10 mg total) by mouth daily for 10 days.   diclofenac sodium (VOLTAREN) 1 % GEL Apply 2 g topically 3 (three) times daily as needed. (Patient not taking: Reported on 10/17/2021)   No facility-administered encounter medications on file as of 10/17/2021.    ALLERGIES:  No Known Allergies   FAMILY HISTORY:  History reviewed. No pertinent family history.   SOCIAL HISTORY:  Social Connections: Not on file    REVIEW OF SYSTEMS:  Denies appetite changes, fevers, chills, fatigue, unexplained weight changes. Denies hearing loss, neck lumps or masses, mouth sores, ringing in ears or voice changes. Denies cough or wheezing.  Denies shortness of breath. Denies chest pain or palpitations. Denies leg swelling. Denies abdominal distention, pain, blood in stools, constipation, diarrhea, nausea, vomiting, or early satiety. Denies pain with intercourse, dysuria, frequency, hematuria or incontinence. Denies hot flashes, pelvic pain.   Denies joint pain, back pain or muscle pain/cramps. Denies itching, rash, or wounds. Denies dizziness, headaches, numbness or seizures. Denies swollen lymph nodes or glands, denies easy bruising or bleeding. Denies anxiety, depression, confusion, or decreased concentration.  Physical Exam:  Vital Signs for this encounter:  Blood pressure (!) 191/116, pulse 94, temperature (!) 97.5 F (36.4 C), temperature source Tympanic, resp. rate 18, height 5\' 2"  (1.575 m), weight 202 lb (91.6 kg), SpO2 99 %. Body mass index is 36.95 kg/m. General: Alert, oriented, no acute distress.  HEENT: Normocephalic, atraumatic. Sclera anicteric.  Chest: Clear to auscultation bilaterally. No wheezes, rhonchi, or rales. Cardiovascular:  Regular rate and rhythm, no murmurs, rubs, or gallops.  Abdomen: Obese. Normoactive bowel sounds. Soft, nondistended, nontender to palpation. No masses or hepatosplenomegaly appreciated. No palpable fluid wave.  Well-healed right lower quadrant scar. Extremities: Grossly normal range of motion. Warm, well perfused. No edema bilaterally.  Skin: No rashes or lesions.  Lymphatics: No cervical, supraclavicular, or inguinal adenopathy.  GU:  Normal external female genitalia.  No lesions. No discharge or bleeding.             Bladder/urethra:  No lesions or masses, well supported bladder             Vagina: Well rugated, no lesions or masses.             Cervix: Normal size, anteriorly there appears to be a nabothian cyst although some atypical vascularity over this area.  Biopsy taken as noted below.             Uterus: 8-10 cm, mobile, no parametrial involvement or nodularity.             Adnexa: No masses appreciated.  Rectal: No nodularity.  Cervical biopsy procedure Preoperative diagnosis: cervical lesion Postoperative diagnosis: same  as above Physician: Berline Lopes MD EBL: minimal Specimen: anterior cervical biopsy Procedure: After procedure was discussed with the patient and verbal consent obtained, a speculum was placed in the vagina and cervix well visualized. The cervix was cleansed with betadine x3. A biopsy of the anterior cervix was taken with Tischler forceps. Hemostasis was noted. All instruments were removed from the vagina. Overall, the patient tolerated the procedure well.  LABORATORY AND RADIOLOGIC DATA:  Outside medical records were reviewed to synthesize the above history, along with the history and physical obtained during the visit.   Lab Results  Component Value Date   WBC 8.2 09/29/2021   HGB 13.1 09/29/2021   HCT 40.2 09/29/2021   PLT 254 09/29/2021   GLUCOSE 122 (H) 07/07/2021   CHOL 204 (H) 09/29/2015   TRIG 153 (H) 09/29/2015   HDL 44 (L) 09/29/2015   LDLCALC 129  09/29/2015   ALT 26 07/07/2021   AST 20 07/07/2021   NA 140 07/07/2021   K 3.4 (L) 07/07/2021   CL 102 07/07/2021   CREATININE 1.12 (H) 07/07/2021   BUN 17 07/07/2021   CO2 28 07/07/2021   TSH 2.218 08/21/2014   INR 1.0 07/07/2021   HGBA1C 4.9 04/20/2014   12/1: A. 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  - See comment.  - Deeper sections examined.   COMMENT:   Sections demonstrate at least atypical endometrial hyperplasia/EIC in a  background of extensive mucinous metaplasia, syncytial papillary change,  and acute inflammation. The findings are concerning for but not  diagnostic of endometrial adenocarcinoma.   Pelvic ultrasound 12/2: FINDINGS: Uterus   Measurements: 9.3 x 5.3 x 6.3 cm = volume: 163 mL. Anteverted. Normal morphology without mass. Nabothian cysts at cervix.   Endometrium   Thickness: 18 mm. Thickened, heterogeneous, with several areas of hypoechogenicity, question related to preceding endometrial biopsy 1 day ago. Largest area of hypoechogenicity measures 13 x 8 x 8 mm. No discrete mass or calcification.   Right ovary   Not visualized, likely obscured by bowel   Left ovary   Measurements: 2.7 x 3.4 x 1.9 cm = volume: 9.0 mL. Normal morphology without mass   Other findings   No free pelvic fluid.  No adnexal masses.

## 2021-10-17 ENCOUNTER — Ambulatory Visit (HOSPITAL_BASED_OUTPATIENT_CLINIC_OR_DEPARTMENT_OTHER): Payer: Medicaid Other | Admitting: Gynecologic Oncology

## 2021-10-17 ENCOUNTER — Encounter: Payer: Self-pay | Admitting: Gynecologic Oncology

## 2021-10-17 ENCOUNTER — Other Ambulatory Visit: Payer: Self-pay

## 2021-10-17 ENCOUNTER — Encounter: Payer: Self-pay | Admitting: Oncology

## 2021-10-17 ENCOUNTER — Inpatient Hospital Stay: Payer: Medicare Other | Attending: Gynecologic Oncology | Admitting: Gynecologic Oncology

## 2021-10-17 VITALS — BP 191/116 | HR 94 | Temp 97.5°F | Resp 18 | Ht 62.0 in | Wt 202.0 lb

## 2021-10-17 DIAGNOSIS — N889 Noninflammatory disorder of cervix uteri, unspecified: Secondary | ICD-10-CM

## 2021-10-17 DIAGNOSIS — Z6836 Body mass index (BMI) 36.0-36.9, adult: Secondary | ICD-10-CM | POA: Diagnosis not present

## 2021-10-17 DIAGNOSIS — N8502 Endometrial intraepithelial neoplasia [EIN]: Secondary | ICD-10-CM

## 2021-10-17 DIAGNOSIS — I10 Essential (primary) hypertension: Secondary | ICD-10-CM | POA: Diagnosis not present

## 2021-10-17 DIAGNOSIS — Z780 Asymptomatic menopausal state: Secondary | ICD-10-CM | POA: Diagnosis not present

## 2021-10-17 DIAGNOSIS — N950 Postmenopausal bleeding: Secondary | ICD-10-CM | POA: Insufficient documentation

## 2021-10-17 DIAGNOSIS — Z79899 Other long term (current) drug therapy: Secondary | ICD-10-CM | POA: Insufficient documentation

## 2021-10-17 DIAGNOSIS — J45909 Unspecified asthma, uncomplicated: Secondary | ICD-10-CM | POA: Insufficient documentation

## 2021-10-17 NOTE — Progress Notes (Signed)
Patient is established with [facility] for primary care - Dr. Mikal Plane.

## 2021-10-17 NOTE — Patient Instructions (Addendum)
Dr. Berline Lopes took a cervical biopsy today and will contact you with the results. You may have some spotting after this.   Preparing for your Surgery  Plan for surgery on November 09, 2020 with Dr. Jeral Pinch at [facility] will be scheduled for robotic assisted total laparoscopic hysterectomy (removal of the uterus and cervix), bilateral salpingo-oophorectomy (removal of both ovaries and fallopian tubes), sentinel lymph node biopsy, possible lymph node dissection, possible laparotomy (larger incision on your abdomen if needed).   Pre-operative Testing -You will receive a phone call from presurgical testing at [facility] to arrange for a pre-operative appointment and lab work.  -Bring your insurance card, copy of an advanced directive if applicable, medication list  -At that visit, you will be asked to sign a consent for a possible blood transfusion in case a transfusion becomes necessary during surgery.  The need for a blood transfusion is rare but having consent is a necessary part of your care.     -You should not be taking blood thinners or aspirin at least ten days prior to surgery unless instructed by your surgeon.  -Do not take supplements such as fish oil (omega 3), red yeast rice, turmeric before your surgery. You want to avoid medications with aspirin in them including headache powders such as BC or Goody's), Excedrin migraine.  Day Before Surgery at [facility] will be asked to take in a light diet the day before surgery. You will be advised you can have clear liquids up until 3 hours before your surgery.    Eat a light diet the day before surgery.  Examples including soups, broths, toast, yogurt, mashed potatoes.  AVOID GAS PRODUCING FOODS. Things to avoid include carbonated beverages (fizzy beverages, sodas), raw fruits and raw vegetables (uncooked), or beans.   If your bowels are filled with gas, your surgeon will have difficulty visualizing your pelvic  organs which increases your surgical risks.  Your role in recovery Your role is to become active as soon as directed by your doctor, while still giving yourself time to heal.  Rest when you feel tired. You will be asked to do the following in order to speed your recovery:  - Cough and breathe deeply. This helps to clear and expand your lungs and can prevent pneumonia after surgery.  - [place]. Do mild physical activity. Walking or moving your legs help your circulation and body functions return to normal. Do not try to get up or walk alone the first time after surgery.   -If you develop swelling on one leg or the other, pain in the back of your leg, redness/warmth in one of your legs, please call the office or go to the Emergency Room to have a doppler to rule out a blood clot. For shortness of breath, chest pain-seek care in the Emergency Room as soon as possible. - Actively manage your pain. Managing your pain lets you move in comfort. We will ask you to rate your pain on a scale of zero to 10. It is your responsibility to tell your doctor or nurse where and how much you hurt so your pain can be treated.  Special Considerations -If you are diabetic, you may be placed on insulin after surgery to have closer control over your blood sugars to promote healing and recovery.  This does not mean that you will be discharged on insulin.  If applicable, your oral antidiabetics will be resumed when you are  tolerating a solid diet.  -Your final pathology results from surgery should be available around one week after surgery and the results will be relayed to you when available.  -Dr. Lahoma Crocker is the surgeon that assists your GYN Oncologist with surgery.  If you end up staying the night, the next day after your surgery you will either see Dr. Berline Lopes or Dr. Lahoma Crocker.  -FMLA forms can be faxed to [phone] and please allow 5-7 business days for completion.  Pain  Management After Surgery -You will be prescribed pain medication and bowel regimen medications after surgery.  -Make sure that you have Tylenol and Ibuprofen at home to use on a regular basis after surgery for pain control. We recommend alternating the medications every hour to six hours since they work differently and are processed in the body differently for pain relief.  -Review the attached handout on narcotic use and their risks and side effects.   Bowel Regimen -You will be prescribed Sennakot-S to take nightly to prevent constipation especially if you are taking the narcotic pain medication intermittently.  It is important to prevent constipation and drink adequate amounts of liquids. You can stop taking this medication when you are not taking pain medication and you are back on your normal bowel routine.  Risks of Surgery Risks of surgery are low but include bleeding, infection, damage to surrounding structures, re-operation, blood clots, and very rarely death.   Blood Transfusion Information (For the consent to be signed before surgery)  We will be checking your blood type before surgery so in case of emergencies, we will know what type of blood you would need.                                            WHAT IS A BLOOD TRANSFUSION?  A transfusion is the replacement of blood or some of its parts. Blood is made up of multiple cells which provide different functions. Red blood cells carry oxygen and are used for blood loss replacement. White blood cells fight against infection. Platelets control bleeding. Plasma helps clot blood. Other blood products are available for specialized needs, such as hemophilia or other clotting disorders. BEFORE THE TRANSFUSION  Who gives blood for transfusions?  You may be able to donate blood to be used at a later date on yourself (autologous donation). Relatives can be asked to donate blood. This is generally not any safer than if you have received  blood from a stranger. The same precautions are taken to ensure safety when a relative's blood is donated. Healthy volunteers who are fully evaluated to make sure their blood is safe. This is blood bank blood. Transfusion therapy is the safest it has ever been in the practice of medicine. Before blood is taken from a donor, a complete history is taken to make sure that person has no history of diseases nor engages in risky social behavior (examples are intravenous drug use or sexual activity with multiple partners). The donor's travel history is screened to minimize risk of transmitting infections, such as malaria. The donated blood is tested for signs of infectious diseases, such as HIV and hepatitis. The blood is then tested to be sure it is compatible with you in order to minimize the chance of a transfusion reaction. If you or a relative donates blood, this is often done in anticipation of surgery and  is not appropriate for emergency situations. It takes many days to process the donated blood. RISKS AND COMPLICATIONS Although transfusion therapy is very safe and saves many lives, the main dangers of transfusion include:  Getting an infectious disease. Developing a transfusion reaction. This is an allergic reaction to something in the blood you were given. Every precaution is taken to prevent this. The decision to have a blood transfusion has been considered carefully by your caregiver before blood is given. Blood is not given unless the benefits outweigh the risks.  AFTER SURGERY INSTRUCTIONS  Return to work: 4-6 weeks if applicable  Activity: 1. Be up and out of the bed during the day.  Take a nap if needed.  You may walk up steps but be careful and use the hand rail.  Stair climbing will tire you more than you think, you may need to stop part way and rest.   2. No lifting or straining for 6 weeks over 10 pounds. No pushing, pulling, straining for 6 weeks.  3. No driving for 1 week(s).  Do not  drive if you are taking narcotic pain medicine and make sure that your reaction time has returned.   4. You can shower as soon as the next day after surgery. Shower daily.  Use your regular soap and water (not directly on the incision) and pat your incision(s) dry afterwards; don't rub.  No tub baths or submerging your body in water until cleared by your surgeon. If you have the soap that was given to you by pre-surgical testing that was used before surgery, you do not need to use it afterwards because this can irritate your incisions.   5. No sexual activity and nothing in the vagina for 8 weeks.  6. You may experience a small amount of clear drainage from your incisions, which is normal.  If the drainage persists, increases, or changes color please call the office.  7. Do not use creams, lotions, or ointments such as neosporin on your incisions after surgery until advised by your surgeon because they can cause removal of the dermabond glue on your incisions.    8. You may experience vaginal spotting after surgery or around the 6-8 week mark from surgery when the stitches at the top of the vagina begin to dissolve.  The spotting is normal but if you experience heavy bleeding, call our office.  9. Take Tylenol or ibuprofen first for pain and only use narcotic pain medication for severe pain not relieved by the Tylenol or Ibuprofen.  Monitor your Tylenol intake to a max of 4,000 mg in a 24 hour period. You can alternate these medications after surgery.  Diet: 1. Low sodium Heart Healthy Diet is recommended but you are cleared to resume your normal (before surgery) diet after your procedure.  2. It is safe to use a laxative, such as Miralax or Colace, if you have difficulty moving your bowels. You have been prescribed Sennakot-S to take at bedtime every evening after surgery to keep bowel movements regular and to prevent constipation.    Wound Care: 1. Keep clean and dry.  Shower daily.  Reasons  to call the Doctor: Fever - Oral temperature greater than 100.4 degrees Fahrenheit Foul-smelling vaginal discharge Difficulty urinating Nausea and vomiting Increased pain at the site of the incision that is unrelieved with pain medicine. Difficulty breathing with or without chest pain New calf pain especially if only on one side Sudden, continuing increased vaginal bleeding with or without clots.  Contacts: For questions or concerns you should contact:  Dr. Jeral Pinch at [phone]  Joylene John, NP at [phone]  After Hours: call [phone] and have the GYN Oncologist paged/contacted (after 5 pm or on the weekends).  Messages sent via mychart are for non-urgent matters and are not responded to after hours so for urgent needs, please call the after hours number.

## 2021-10-19 ENCOUNTER — Telehealth: Payer: Self-pay | Admitting: Oncology

## 2021-10-19 LAB — SURGICAL PATHOLOGY

## 2021-10-19 NOTE — Telephone Encounter (Signed)
Left message with the assistance of [employer] regarding appointment tomorrow with Dr. Carney Bern at 3:30.  Requested a return call to confirm.  Patient's daughter, Eye, called back and confirmed appointment.

## 2021-10-19 NOTE — Telephone Encounter (Signed)
Eye called back and said her mother saw Dr. Mikal Plane this morning and her BP was 137/80.  They need to cancel the appointment for tomorrow.  Called [facility] and canceled appointment for 10/20/21 and requested for the office notes to be faxed to [phone].

## 2021-10-20 ENCOUNTER — Telehealth: Payer: Self-pay

## 2021-10-20 NOTE — Telephone Encounter (Signed)
Called patients daughter in regards to cervix biopsy is negative for precancer or cancer.  Left a message to return call.

## 2021-10-20 NOTE — Progress Notes (Signed)
Patient here with a family member and an interpreter for new patient consultation with Dr. Berline Lopes and for a pre-operative discussion prior to her scheduled surgery on November 09, 2021. She is scheduled for robotic assisted total laparoscopic hysterectomy, bilateral salpingo-oophorectomy, sentinel lymph node biopsy, possible lymph node dissection, possible laparotomy. The surgery was discussed in detail.  See after visit summary for additional details. Visual aids used to discuss items related to surgery including the female reproductive system to discuss surgery in detail.      Discussed post-op pain management in detail including the aspects of the enhanced recovery pathway. Her prescriptions will be sent in post-operatively.    Discussed the use of SCDs and measures to take at home to prevent DVT including frequent mobility.  Reportable signs and symptoms of DVT discussed. Post-operative instructions discussed and expectations for after surgery. Incisional care discussed as well including reportable signs and symptoms including erythema, drainage, wound separation.     15 minutes spent with the patient.  Verbalizing understanding of material discussed. No needs or concerns voiced at the end of the visit. Advised patient and family to call for any needs. Advised she would be contacted with the results of her cervical biopsy from today. Patient offered an earlier surgical date but she would like to proceed with her colonoscopy on Nov 03, 2021 first.  This appointment is included in the global surgical bundle as pre-operative teaching and has no charge.

## 2021-10-28 NOTE — Progress Notes (Addendum)
Ipad interpreter used during interview. Family member also present.   COVID swab appointment: n/a  COVID Vaccine Completed: Date COVID Vaccine completed: Has received booster: COVID vaccine manufacturer: [employer]   Date of COVID positive in last 90 days: no  PCP - Vira Browns, MD Cardiologist - Dr Manuella Ghazi   Chest x-ray - n/a EKG - 07/08/21 Epic Stress Test - n/a ECHO - n/a Cardiac Cath - n/a Pacemaker/ICD device last checked: n/a Spinal Cord Stimulator: n/a  Sleep Study - n/a CPAP -   Fasting Blood Sugar - n/a Checks Blood Sugar _____ times a day  Blood Thinner Instructions: n/a Aspirin Instructions: Last Dose:  Activity level: Can go up a flight of stairs and perform activities of daily living without stopping and without symptoms of chest pain or shortness of breath.       Anesthesia review:   Patient denies shortness of breath, fever, cough and chest pain at PAT appointment   Patient verbalized understanding of instructions that were given to them at the PAT appointment. Patient was also instructed that they will need to review over the PAT instructions again at home before surgery.

## 2021-10-28 NOTE — Patient Instructions (Addendum)
DUE TO COVID-19 ONLY ONE VISITOR IS ALLOWED TO COME WITH YOU AND STAY IN THE WAITING ROOM ONLY DURING PRE OP AND PROCEDURE.   **NO VISITORS ARE ALLOWED IN THE SHORT STAY AREA OR RECOVERY ROOM!!**   Your procedure is scheduled on: 11/09/21   Report to [facility] Main Entrance    Report to admitting at 6:15 AM   Call this number if you have problems the morning of surgery [phone]   Do not eat food :After Midnight.   May have liquids until 5:30 AM day of surgery  CLEAR LIQUID DIET  Foods Allowed                                                                     Foods Excluded  Water, Black Coffee and tea (no milk or creamer)           liquids that you cannot  Plain Jell-O in any flavor  (No red)                                    see through such as: Fruit ices (not with fruit pulp)                                            milk, soups, orange juice              Iced Popsicles (No red)                                               All solid food                                   Apple juices Sports drinks like Gatorade (No red) Lightly seasoned clear broth or consume(fat free) Sugar  Oral Hygiene is also important to reduce your risk of infection.                                    Remember - BRUSH YOUR TEETH THE MORNING OF SURGERY WITH YOUR REGULAR TOOTHPASTE   Take these medicines the morning of surgery with A SIP OF WATER: Amlodipine             You may not have any metal on your body including hair pins, jewelry, and body piercing             Do not wear make-up, lotions, powders, perfumes, or deodorant  Do not wear nail polish including gel and S&S, artificial/acrylic nails, or any other type of covering on natural nails including finger and toenails. If you have artificial nails, gel coating, etc. that needs to be removed by a nail salon please have this removed prior to surgery or surgery may need to be canceled/ delayed if the surgeon/ anesthesia  feels like  they are unable to be safely monitored.   Do not shave  48 hours prior to surgery.    Do not bring valuables to the hospital. [place].   Contacts, dentures or bridgework may not be worn into surgery.    Patients discharged on the day of surgery will not be allowed to drive home.              Please read over the following fact sheets you were given: IF YOU HAVE QUESTIONS ABOUT YOUR PRE-OP INSTRUCTIONS PLEASE CALL [facility] - Preparing for Surgery Before surgery, you can play an important role.  Because skin is not sterile, your skin needs to be as free of germs as possible.  You can reduce the number of germs on your skin by washing with CHG (chlorahexidine gluconate) soap before surgery.  CHG is an antiseptic cleaner which kills germs and bonds with the skin to continue killing germs even after washing. Please DO NOT use if you have an allergy to CHG or antibacterial soaps.  If your skin becomes reddened/irritated stop using the CHG and inform your nurse when you arrive at Short Stay. Do not shave (including legs and underarms) for at least 48 hours prior to the first CHG shower.  You may shave your face/neck.  Please follow these instructions carefully:  1.  Shower with CHG Soap the night before surgery and the  morning of surgery.  2.  If you choose to wash your hair, wash your hair first as usual with your normal  shampoo.  3.  After you shampoo, rinse your hair and body thoroughly to remove the shampoo.                             4.  Use CHG as you would any other liquid soap.  You can apply chg directly to the skin and wash.  Gently with a scrungie or clean washcloth.  5.  Apply the CHG Soap to your body ONLY FROM THE NECK DOWN.   Do   not use on face/ open                           Wound or open sores. Avoid contact with eyes, ears mouth and   genitals (private parts).                       Wash face,   Genitals (private parts) with your normal soap.             6.  Wash thoroughly, paying special attention to the area where your    surgery  will be performed.  7.  Thoroughly rinse your body with warm water from the neck down.  8.  DO NOT shower/wash with your normal soap after using and rinsing off the CHG Soap.                9.  Pat yourself dry with a clean towel.            10.  Wear clean pajamas.            11.  Place clean sheets on your bed the night of your first shower and do not  sleep  with pets. Day of Surgery : Do not apply any lotions/deodorants the morning of surgery.  Please wear clean clothes to the hospital/surgery center.  FAILURE TO FOLLOW THESE INSTRUCTIONS MAY RESULT IN THE CANCELLATION OF YOUR SURGERY  PATIENT SIGNATURE_________________________________  NURSE SIGNATURE__________________________________  ________________________________________________________________________  WHAT IS A BLOOD TRANSFUSION? Blood Transfusion Information  A transfusion is the replacement of blood or some of its parts. Blood is made up of multiple cells which provide different functions. Red blood cells carry oxygen and are used for blood loss replacement. White blood cells fight against infection. Platelets control bleeding. Plasma helps clot blood. Other blood products are available for specialized needs, such as hemophilia or other clotting disorders. BEFORE THE TRANSFUSION  Who gives blood for transfusions?  Healthy volunteers who are fully evaluated to make sure their blood is safe. This is blood bank blood. Transfusion therapy is the safest it has ever been in the practice of medicine. Before blood is taken from a donor, a complete history is taken to make sure that person has no history of diseases nor engages in risky social behavior (examples are intravenous drug use or sexual activity with multiple partners). The donor's travel history is screened to minimize risk of  transmitting infections, such as malaria. The donated blood is tested for signs of infectious diseases, such as HIV and hepatitis. The blood is then tested to be sure it is compatible with you in order to minimize the chance of a transfusion reaction. If you or a relative donates blood, this is often done in anticipation of surgery and is not appropriate for emergency situations. It takes many days to process the donated blood. RISKS AND COMPLICATIONS Although transfusion therapy is very safe and saves many lives, the main dangers of transfusion include:  Getting an infectious disease. Developing a transfusion reaction. This is an allergic reaction to something in the blood you were given. Every precaution is taken to prevent this. The decision to have a blood transfusion has been considered carefully by your caregiver before blood is given. Blood is not given unless the benefits outweigh the risks. AFTER THE TRANSFUSION Right after receiving a blood transfusion, you will usually feel much better and more energetic. This is especially true if your red blood cells have gotten low (anemic). The transfusion raises the level of the red blood cells which carry oxygen, and this usually causes an energy increase. The nurse administering the transfusion will monitor you carefully for complications. HOME CARE INSTRUCTIONS  No special instructions are needed after a transfusion. You may find your energy is better. Speak with your caregiver about any limitations on activity for underlying diseases you may have. SEEK MEDICAL CARE IF:  Your condition is not improving after your transfusion. You develop redness or irritation at the intravenous (IV) site. SEEK IMMEDIATE MEDICAL CARE IF:  Any of the following symptoms occur over the next 12 hours: Shaking chills. You have a temperature by mouth above 102 F (38.9 C), not controlled by medicine. Chest, back, or muscle pain. People around you feel you are not  acting correctly or are confused. Shortness of breath or difficulty breathing. Dizziness and fainting. You get a rash or develop hives. You have a decrease in urine output. Your urine turns a dark color or changes to pink, red, or brown. Any of the following symptoms occur over the next 10 days: You have a temperature by mouth above 102 F (38.9 C), not controlled by medicine. Shortness of breath. Weakness after normal  activity. The white part of the eye turns yellow (jaundice). You have a decrease in the amount of urine or are urinating less often. Your urine turns a dark color or changes to pink, red, or brown. Document Released: 10/13/2000 Document Revised: 01/08/2012 Document Reviewed: 06/01/2008 [facility] Patient Information 2014 [place], [place].  _______________________________________________________________________

## 2021-11-01 ENCOUNTER — Encounter (HOSPITAL_COMMUNITY)
Admission: RE | Admit: 2021-11-01 | Discharge: 2021-11-01 | Disposition: A | Discharge status: Home / Self Care | Payer: Medicare Other | Source: Ambulatory Visit | Attending: Gynecologic Oncology | Admitting: Gynecologic Oncology

## 2021-11-01 ENCOUNTER — Other Ambulatory Visit: Payer: Self-pay

## 2021-11-01 ENCOUNTER — Encounter (HOSPITAL_COMMUNITY): Payer: Self-pay

## 2021-11-01 DIAGNOSIS — N8502 Endometrial intraepithelial neoplasia [EIN]: Secondary | ICD-10-CM | POA: Insufficient documentation

## 2021-11-01 DIAGNOSIS — Z01812 Encounter for preprocedural laboratory examination: Secondary | ICD-10-CM | POA: Insufficient documentation

## 2021-11-01 LAB — COMPREHENSIVE METABOLIC PANEL
ALT: 31 U/L (ref 0–44)
AST: 17 U/L (ref 15–41)
Albumin: 4 g/dL (ref 3.5–5.0)
Alkaline Phosphatase: 62 U/L (ref 38–126)
Anion gap: 5 (ref 5–15)
BUN: 25 mg/dL — ABNORMAL HIGH (ref 8–23)
CO2: 26 mmol/L (ref 22–32)
Calcium: 9.3 mg/dL (ref 8.9–10.3)
Chloride: 107 mmol/L (ref 98–111)
Creatinine, Ser: 0.76 mg/dL (ref 0.44–1.00)
GFR, Estimated: >60 mL/min (ref >60)
Glucose, Bld: 109 mg/dL — ABNORMAL HIGH (ref 70–99)
Potassium: 4 mmol/L (ref 3.5–5.1)
Sodium: 138 mmol/L (ref 135–145)
Total Bilirubin: 1 mg/dL (ref 0.3–1.2)
Total Protein: 7.5 g/dL (ref 6.5–8.1)

## 2021-11-01 LAB — CBC
HCT: 41.5 % (ref 36.0–46.0)
Hemoglobin: 13.3 g/dL (ref 12.0–15.0)
MCH: 30 pg (ref 26.0–34.0)
MCHC: 32 g/dL (ref 30.0–36.0)
MCV: 93.7 fL (ref 80.0–100.0)
Platelets: 224 10*3/uL (ref 150–400)
RBC: 4.43 MIL/uL (ref 3.87–5.11)
RDW: 12.7 % (ref 11.5–15.5)
WBC: 9.6 10*3/uL (ref 4.0–10.5)
nRBC: 0 % (ref 0.0–0.2)

## 2021-11-08 ENCOUNTER — Telehealth: Payer: Self-pay

## 2021-11-08 NOTE — Telephone Encounter (Signed)
Attempted to reach patient to check in with her pre-operatively. Interpreter [place] (ID [id number]) was used to make the call. Phone number [phone] was dialed but was instructed to call the patient at [phone]. Unable to contact patient at second number, voicemail left requesting return call.

## 2021-11-09 ENCOUNTER — Ambulatory Visit (HOSPITAL_COMMUNITY): Payer: Medicare Other | Admitting: Certified Registered Nurse Anesthetist

## 2021-11-09 ENCOUNTER — Encounter (HOSPITAL_COMMUNITY)
Admission: RE | Disposition: A | Discharge status: Home / Self Care | Payer: Self-pay | Source: Ambulatory Visit | Attending: Gynecologic Oncology

## 2021-11-09 ENCOUNTER — Encounter (HOSPITAL_COMMUNITY): Payer: Self-pay | Admitting: Gynecologic Oncology

## 2021-11-09 ENCOUNTER — Ambulatory Visit (HOSPITAL_COMMUNITY)
Admission: RE | Admit: 2021-11-09 | Discharge: 2021-11-09 | Disposition: A | Discharge status: Home / Self Care | Payer: Medicare Other | Source: Ambulatory Visit | Attending: Gynecologic Oncology | Admitting: Gynecologic Oncology

## 2021-11-09 DIAGNOSIS — Z79899 Other long term (current) drug therapy: Secondary | ICD-10-CM | POA: Diagnosis not present

## 2021-11-09 DIAGNOSIS — I10 Essential (primary) hypertension: Secondary | ICD-10-CM | POA: Diagnosis not present

## 2021-11-09 DIAGNOSIS — Z7951 Long term (current) use of inhaled steroids: Secondary | ICD-10-CM | POA: Diagnosis not present

## 2021-11-09 DIAGNOSIS — D271 Benign neoplasm of left ovary: Secondary | ICD-10-CM | POA: Insufficient documentation

## 2021-11-09 DIAGNOSIS — J45909 Unspecified asthma, uncomplicated: Secondary | ICD-10-CM | POA: Diagnosis not present

## 2021-11-09 DIAGNOSIS — C541 Malignant neoplasm of endometrium: Secondary | ICD-10-CM | POA: Insufficient documentation

## 2021-11-09 DIAGNOSIS — N8502 Endometrial intraepithelial neoplasia [EIN]: Secondary | ICD-10-CM | POA: Diagnosis present

## 2021-11-09 DIAGNOSIS — Z87891 Personal history of nicotine dependence: Secondary | ICD-10-CM | POA: Insufficient documentation

## 2021-11-09 HISTORY — PX: ROBOTIC ASSISTED TOTAL HYSTERECTOMY WITH BILATERAL SALPINGO OOPHERECTOMY: SHX6086

## 2021-11-09 HISTORY — PX: SENTINEL NODE BIOPSY: SHX6608

## 2021-11-09 LAB — TYPE AND SCREEN
ABO/RH(D): O POS
Antibody Screen: NEGATIVE

## 2021-11-09 LAB — ABO/RH: ABO/RH(D): O POS

## 2021-11-09 SURGERY — HYSTERECTOMY, TOTAL, ROBOT-ASSISTED, LAPAROSCOPIC, WITH BILATERAL SALPINGO-OOPHORECTOMY
Anesthesia: General

## 2021-11-09 MED ORDER — TRAMADOL HCL 50 MG PO TABS: 50.0000 mg | ORAL_TABLET | Freq: Four times a day (QID) | ORAL | 0 refills | Status: DC | PRN

## 2021-11-09 MED ORDER — CHLORHEXIDINE GLUCONATE 0.12 % MT SOLN: 15.0000 mL | Freq: Once | OROMUCOSAL | Status: AC

## 2021-11-09 MED ORDER — MIDAZOLAM HCL 5 MG/5ML IJ SOLN
INTRAMUSCULAR | Status: DC | PRN
Start: 2021-11-09 — End: 2021-11-09
  Administered 2021-11-09: 1 mg via INTRAVENOUS

## 2021-11-09 MED ORDER — BUPIVACAINE HCL 0.25 % IJ SOLN
INTRAMUSCULAR | Status: DC | PRN
  Administered 2021-11-09: 30 mL

## 2021-11-09 MED ORDER — DEXAMETHASONE SODIUM PHOSPHATE 10 MG/ML IJ SOLN
INTRAMUSCULAR | Status: AC
  Filled 2021-11-09: qty 1

## 2021-11-09 MED ORDER — PHENYLEPHRINE 40 MCG/ML (10ML) SYRINGE FOR IV PUSH (FOR BLOOD PRESSURE SUPPORT)
PREFILLED_SYRINGE | INTRAVENOUS | Status: DC | PRN
  Administered 2021-11-09 (×2): 80 ug via INTRAVENOUS
  Administered 2021-11-09: 120 ug via INTRAVENOUS
  Administered 2021-11-09: 80 ug via INTRAVENOUS

## 2021-11-09 MED ORDER — ORAL CARE MOUTH RINSE
15.0000 mL | Freq: Once | OROMUCOSAL | Status: AC
  Administered 2021-11-09: 15 mL via OROMUCOSAL

## 2021-11-09 MED ORDER — LIDOCAINE 2% (20 MG/ML) 5 ML SYRINGE
INTRAMUSCULAR | Status: DC | PRN
  Administered 2021-11-09: 100 mg via INTRAVENOUS

## 2021-11-09 MED ORDER — PHENYLEPHRINE HCL (PRESSORS) 10 MG/ML IV SOLN
INTRAVENOUS | Status: AC
  Filled 2021-11-09: qty 1

## 2021-11-09 MED ORDER — KETAMINE HCL 50 MG/5ML IJ SOSY
PREFILLED_SYRINGE | INTRAMUSCULAR | Status: AC
  Filled 2021-11-09: qty 5

## 2021-11-09 MED ORDER — SENNOSIDES-DOCUSATE SODIUM 8.6-50 MG PO TABS: 2.0000 | ORAL_TABLET | Freq: Every day | ORAL | 0 refills | Status: DC

## 2021-11-09 MED ORDER — SUGAMMADEX SODIUM 200 MG/2ML IV SOLN
INTRAVENOUS | Status: DC | PRN
  Administered 2021-11-09: 200 mg via INTRAVENOUS

## 2021-11-09 MED ORDER — HYDROMORPHONE HCL 1 MG/ML IJ SOLN: 0.2500 mg | INTRAMUSCULAR | Status: DC | PRN

## 2021-11-09 MED ORDER — ROCURONIUM BROMIDE 10 MG/ML (PF) SYRINGE
PREFILLED_SYRINGE | INTRAVENOUS | Status: DC | PRN
  Administered 2021-11-09: 20 mg via INTRAVENOUS
  Administered 2021-11-09: 70 mg via INTRAVENOUS
  Administered 2021-11-09: 20 mg via INTRAVENOUS

## 2021-11-09 MED ORDER — MIDAZOLAM HCL 2 MG/2ML IJ SOLN
INTRAMUSCULAR | Status: AC
  Filled 2021-11-09: qty 2

## 2021-11-09 MED ORDER — EPHEDRINE SULFATE-NACL 50-0.9 MG/10ML-% IV SOSY
PREFILLED_SYRINGE | INTRAVENOUS | Status: DC | PRN
Start: 2021-11-09 — End: 2021-11-09
  Administered 2021-11-09 (×2): 5 mg via INTRAVENOUS

## 2021-11-09 MED ORDER — FENTANYL CITRATE (PF) 100 MCG/2ML IJ SOLN
INTRAMUSCULAR | Status: AC
  Filled 2021-11-09: qty 2

## 2021-11-09 MED ORDER — OXYCODONE HCL 5 MG PO TABS: 5.0000 mg | ORAL_TABLET | Freq: Once | ORAL | Status: DC | PRN

## 2021-11-09 MED ORDER — LACTATED RINGERS IV SOLN: INTRAVENOUS | Status: DC

## 2021-11-09 MED ORDER — DEXAMETHASONE SODIUM PHOSPHATE 4 MG/ML IJ SOLN: 4.0000 mg | INTRAMUSCULAR | Status: DC

## 2021-11-09 MED ORDER — PROPOFOL 10 MG/ML IV BOLUS
INTRAVENOUS | Status: DC | PRN
  Administered 2021-11-09: 150 mg via INTRAVENOUS

## 2021-11-09 MED ORDER — ONDANSETRON HCL 4 MG/2ML IJ SOLN
4.0000 mg | Freq: Once | INTRAMUSCULAR | Status: AC | PRN
  Administered 2021-11-09: 4 mg via INTRAVENOUS

## 2021-11-09 MED ORDER — FENTANYL CITRATE (PF) 100 MCG/2ML IJ SOLN
INTRAMUSCULAR | Status: DC | PRN
  Administered 2021-11-09: 25 ug via INTRAVENOUS
  Administered 2021-11-09 (×3): 50 ug via INTRAVENOUS
  Administered 2021-11-09: 25 ug via INTRAVENOUS
  Administered 2021-11-09: 50 ug via INTRAVENOUS

## 2021-11-09 MED ORDER — BUPIVACAINE HCL 0.25 % IJ SOLN
INTRAMUSCULAR | Status: AC
  Filled 2021-11-09: qty 1

## 2021-11-09 MED ORDER — ONDANSETRON HCL 4 MG/2ML IJ SOLN
INTRAMUSCULAR | Status: DC | PRN
Start: 2021-11-09 — End: 2021-11-09
  Administered 2021-11-09: 4 mg via INTRAVENOUS

## 2021-11-09 MED ORDER — LACTATED RINGERS IR SOLN
Status: DC | PRN
  Administered 2021-11-09: 1000 mL

## 2021-11-09 MED ORDER — ONDANSETRON HCL 4 MG/2ML IJ SOLN
INTRAMUSCULAR | Status: AC
  Filled 2021-11-09: qty 2

## 2021-11-09 MED ORDER — DEXAMETHASONE SODIUM PHOSPHATE 10 MG/ML IJ SOLN
INTRAMUSCULAR | Status: DC | PRN
  Administered 2021-11-09: 5 mg via INTRAVENOUS

## 2021-11-09 MED ORDER — STERILE WATER FOR INJECTION IJ SOLN
INTRAMUSCULAR | Status: DC | PRN
  Administered 2021-11-09: 4 mL

## 2021-11-09 MED ORDER — CEFAZOLIN SODIUM-DEXTROSE 2-4 GM/100ML-% IV SOLN
2.0000 g | INTRAVENOUS | Status: AC
  Administered 2021-11-09: 2 g via INTRAVENOUS
  Filled 2021-11-09: qty 100

## 2021-11-09 MED ORDER — ROCURONIUM BROMIDE 10 MG/ML (PF) SYRINGE
PREFILLED_SYRINGE | INTRAVENOUS | Status: AC
  Filled 2021-11-09: qty 10

## 2021-11-09 MED ORDER — HYDRALAZINE HCL 20 MG/ML IJ SOLN
INTRAMUSCULAR | Status: AC
  Administered 2021-11-09: 10 mg via INTRAVENOUS
  Filled 2021-11-09: qty 1

## 2021-11-09 MED ORDER — HEPARIN SODIUM (PORCINE) 5000 UNIT/ML IJ SOLN
5000.0000 [IU] | INTRAMUSCULAR | Status: AC
Start: 2021-11-09 — End: 2021-11-09
  Administered 2021-11-09: 5000 [IU] via SUBCUTANEOUS
  Filled 2021-11-09: qty 1

## 2021-11-09 MED ORDER — LIDOCAINE HCL (PF) 2 % IJ SOLN
INTRAMUSCULAR | Status: AC
  Filled 2021-11-09: qty 20

## 2021-11-09 MED ORDER — STERILE WATER FOR INJECTION IJ SOLN
INTRAMUSCULAR | Status: AC
  Filled 2021-11-09: qty 10

## 2021-11-09 MED ORDER — OXYCODONE HCL 5 MG/5ML PO SOLN: 5.0000 mg | Freq: Once | ORAL | Status: DC | PRN

## 2021-11-09 MED ORDER — STERILE WATER FOR INJECTION IJ SOLN
INTRAMUSCULAR | Status: DC | PRN
  Administered 2021-11-09: 10 mL

## 2021-11-09 MED ORDER — PHENYLEPHRINE HCL-NACL 20-0.9 MG/250ML-% IV SOLN
INTRAVENOUS | Status: DC | PRN
  Administered 2021-11-09: 50 ug/min via INTRAVENOUS

## 2021-11-09 MED ORDER — STERILE WATER FOR IRRIGATION IR SOLN
Status: DC | PRN
  Administered 2021-11-09: 1000 mL

## 2021-11-09 MED ORDER — EPHEDRINE 5 MG/ML INJ
INTRAVENOUS | Status: AC
  Filled 2021-11-09: qty 5

## 2021-11-09 MED ORDER — HYDRALAZINE HCL 20 MG/ML IJ SOLN: 10.0000 mg | Freq: Once | INTRAMUSCULAR | Status: AC | PRN

## 2021-11-09 MED ORDER — PHENYLEPHRINE 40 MCG/ML (10ML) SYRINGE FOR IV PUSH (FOR BLOOD PRESSURE SUPPORT)
PREFILLED_SYRINGE | INTRAVENOUS | Status: AC
  Filled 2021-11-09: qty 10

## 2021-11-09 MED ORDER — ACETAMINOPHEN 500 MG PO TABS
1000.0000 mg | ORAL_TABLET | ORAL | Status: AC
  Administered 2021-11-09: 1000 mg via ORAL
  Filled 2021-11-09: qty 2

## 2021-11-09 MED ORDER — LIDOCAINE HCL (PF) 2 % IJ SOLN
INTRAMUSCULAR | Status: DC | PRN
  Administered 2021-11-09: 1.5 mg/kg/h via INTRADERMAL

## 2021-11-09 MED ORDER — IBUPROFEN 600 MG PO TABS: 600.0000 mg | ORAL_TABLET | Freq: Four times a day (QID) | ORAL | 0 refills | Status: DC | PRN

## 2021-11-09 MED ORDER — PROPOFOL 10 MG/ML IV BOLUS
INTRAVENOUS | Status: AC
  Filled 2021-11-09: qty 20

## 2021-11-09 MED ORDER — KETAMINE HCL 10 MG/ML IJ SOLN
INTRAMUSCULAR | Status: DC | PRN
  Administered 2021-11-09: 20 mg via INTRAVENOUS

## 2021-11-09 MED ORDER — DROPERIDOL 2.5 MG/ML IJ SOLN
INTRAMUSCULAR | Status: DC | PRN
  Administered 2021-11-09: .625 mg via INTRAVENOUS

## 2021-11-09 MED ORDER — LIDOCAINE 2% (20 MG/ML) 5 ML SYRINGE
INTRAMUSCULAR | Status: AC
  Filled 2021-11-09: qty 5

## 2021-11-09 SURGICAL SUPPLY — 72 items
APPLICATOR SURGIFLO ENDO (HEMOSTASIS) IMPLANT
BACTOSHIELD CHG 4% 4OZ (MISCELLANEOUS) ×1
BAG COUNTER SPONGE SURGICOUNT (BAG) IMPLANT
BAG LAPAROSCOPIC 12 15 PORT 16 (BASKET) IMPLANT
BAG RETRIEVAL 12/15 (BASKET) ×2
BLADE SURG SZ10 CARB STEEL (BLADE) IMPLANT
COVER BACK TABLE 60X90IN (DRAPES) ×2 IMPLANT
COVER TIP SHEARS 8 DVNC (MISCELLANEOUS) ×1 IMPLANT
COVER TIP SHEARS 8MM DA VINCI (MISCELLANEOUS) ×1
DECANTER SPIKE VIAL GLASS SM (MISCELLANEOUS) ×2 IMPLANT
DERMABOND ADVANCED (GAUZE/BANDAGES/DRESSINGS) ×1
DERMABOND ADVANCED .7 DNX12 (GAUZE/BANDAGES/DRESSINGS) ×1 IMPLANT
DRAPE ARM DVNC X/XI (DISPOSABLE) ×4 IMPLANT
DRAPE COLUMN DVNC XI (DISPOSABLE) ×1 IMPLANT
DRAPE DA VINCI XI ARM (DISPOSABLE) ×4
DRAPE DA VINCI XI COLUMN (DISPOSABLE) ×1
DRAPE SHEET LG 3/4 BI-LAMINATE (DRAPES) ×2 IMPLANT
DRAPE SURG IRRIG POUCH 19X23 (DRAPES) ×2 IMPLANT
DRSG OPSITE POSTOP 4X6 (GAUZE/BANDAGES/DRESSINGS) IMPLANT
DRSG OPSITE POSTOP 4X8 (GAUZE/BANDAGES/DRESSINGS) IMPLANT
ELECT PENCIL ROCKER SW 15FT (MISCELLANEOUS) ×1 IMPLANT
ELECT REM PT RETURN 15FT ADLT (MISCELLANEOUS) ×2 IMPLANT
GAUZE 4X4 16PLY ~~LOC~~+RFID DBL (SPONGE) ×2 IMPLANT
GLOVE SURG ENC MOIS LTX SZ6 (GLOVE) ×8 IMPLANT
GLOVE SURG ENC MOIS LTX SZ6.5 (GLOVE) ×4 IMPLANT
GOWN STRL REUS W/ TWL LRG LVL3 (GOWN DISPOSABLE) ×4 IMPLANT
GOWN STRL REUS W/TWL LRG LVL3 (GOWN DISPOSABLE) ×4
HOLDER FOLEY CATH W/STRAP (MISCELLANEOUS) IMPLANT
IRRIG SUCT STRYKERFLOW 2 WTIP (MISCELLANEOUS) ×2
IRRIGATION SUCT STRKRFLW 2 WTP (MISCELLANEOUS) ×1 IMPLANT
KIT PROCEDURE DA VINCI SI (MISCELLANEOUS) ×1
KIT PROCEDURE DVNC SI (MISCELLANEOUS) IMPLANT
KIT TURNOVER KIT A (KITS) IMPLANT
MANIPULATOR ADVINCU DEL 3.0 PL (MISCELLANEOUS) IMPLANT
MANIPULATOR ADVINCU DEL 3.5 PL (MISCELLANEOUS) ×1 IMPLANT
MANIPULATOR UTERINE 4.5 ZUMI (MISCELLANEOUS) IMPLANT
NDL HYPO 21X1.5 SAFETY (NEEDLE) ×1 IMPLANT
NDL SPNL 18GX3.5 QUINCKE PK (NEEDLE) IMPLANT
NEEDLE HYPO 21X1.5 SAFETY (NEEDLE) ×2 IMPLANT
NEEDLE SPNL 18GX3.5 QUINCKE PK (NEEDLE) ×2 IMPLANT
OBTURATOR OPTICAL STANDARD 8MM (TROCAR) ×1
OBTURATOR OPTICAL STND 8 DVNC (TROCAR) ×1
OBTURATOR OPTICALSTD 8 DVNC (TROCAR) ×1 IMPLANT
PACK ROBOT GYN CUSTOM WL (TRAY / TRAY PROCEDURE) ×2 IMPLANT
PAD POSITIONING PINK XL (MISCELLANEOUS) ×2 IMPLANT
PORT ACCESS TROCAR AIRSEAL 12 (TROCAR) ×1 IMPLANT
PORT ACCESS TROCAR AIRSEAL 5M (TROCAR) ×1
POUCH SPECIMEN RETRIEVAL 10MM (ENDOMECHANICALS) IMPLANT
SCISSORS LAP 5X45 EPIX DISP (ENDOMECHANICALS) ×1 IMPLANT
SCRUB CHG 4% DYNA-HEX 4OZ (MISCELLANEOUS) ×1 IMPLANT
SEAL CANN UNIV 5-8 DVNC XI (MISCELLANEOUS) ×4 IMPLANT
SEAL XI 5MM-8MM UNIVERSAL (MISCELLANEOUS) ×4
SET TRI-LUMEN FLTR TB AIRSEAL (TUBING) ×2 IMPLANT
SPONGE T-LAP 18X18 ~~LOC~~+RFID (SPONGE) IMPLANT
SURGIFLO W/THROMBIN 8M KIT (HEMOSTASIS) IMPLANT
SUT MNCRL AB 4-0 PS2 18 (SUTURE) IMPLANT
SUT PDS AB 1 TP1 96 (SUTURE) IMPLANT
SUT VIC AB 0 CT1 27 (SUTURE)
SUT VIC AB 0 CT1 27XBRD ANTBC (SUTURE) IMPLANT
SUT VIC AB 2-0 CT1 27 (SUTURE)
SUT VIC AB 2-0 CT1 TAPERPNT 27 (SUTURE) IMPLANT
SUT VIC AB 2-0 SH 27 (SUTURE) ×1
SUT VIC AB 2-0 SH 27X BRD (SUTURE) IMPLANT
SUT VIC AB 4-0 PS2 18 (SUTURE) ×4 IMPLANT
SYR 10ML LL (SYRINGE) IMPLANT
TOWEL OR NON WOVEN STRL DISP B (DISPOSABLE) IMPLANT
TRAP SPECIMEN MUCUS 40CC (MISCELLANEOUS) IMPLANT
TRAY FOLEY MTR SLVR 16FR STAT (SET/KITS/TRAYS/PACK) ×2 IMPLANT
TROCAR XCEL NON-BLD 5MMX100MML (ENDOMECHANICALS) IMPLANT
UNDERPAD 30X36 HEAVY ABSORB (UNDERPADS AND DIAPERS) ×4 IMPLANT
WATER STERILE IRR 1000ML POUR (IV SOLUTION) ×2 IMPLANT
YANKAUER SUCT BULB TIP 10FT TU (MISCELLANEOUS) IMPLANT

## 2021-11-09 NOTE — Discharge Instructions (Signed)
11/09/2021  Return to work: 4-6 weeks if applicable  3 prescriptions have been sent in for you for after surgery. A pain medication called tramadol to be used for moderate pain AS NEEDED. You do not need to take this if you feel you do not need it. Ibuprofen has been sent in to take every six to eight hours for the next 2-3 days to help with pain. Sennakot-S has been sent in to prevent constipation and you will need to take two tablets every night while you are using pain medication. Usually this is for the first week. If you are having loose stools, you do not need to take this.  Activity: 1. Be up and out of the bed during the day.  Take a nap if needed.  You may walk up steps but be careful and use the hand rail.  Stair climbing will tire you more than you think, you may need to stop part way and rest.   2. No lifting or straining over 10 lbs, pushing, pulling, straining for 6 weeks.  3. No driving for around 1 week(s) if you were cleared to drive before surgery.  Do not drive if you are taking narcotic pain medicine. You need to make sure your reaction time has returned and you can brake safely.  4. Shower daily.  Use your regular soap to bathe and when finished pat your incision dry; don't rub.  No tub baths until cleared by your surgeon.   5. No sexual activity and nothing in the vagina for 8 weeks.  6. You may experience a small amount of clear drainage from your incisions, which is normal.  If the drainage persists or increases, please call the office.  7. You may experience vaginal spotting after surgery or around the 6-8 week mark from surgery when the stitches at the top of the vagina begin to dissolve.  The spotting is normal but if you experience heavy bleeding, call our office.  8. Take Tylenol or ibuprofen first for pain and only use narcotic pain medication for severe pain not relieved by the Tylenol or Ibuprofen.  Monitor your Tylenol intake to a max of 4,000 mg.   Diet: 1.  Low sodium Heart Healthy Diet is recommended.  2. It is safe to use a laxative, such as Miralax or Colace, if you have difficulty moving your bowels. You can take Sennakot at bedtime every evening to keep bowel movements regular and to prevent constipation.    Wound Care: 1. Keep clean and dry.  Shower daily.  Reasons to call the Doctor: Fever - Oral temperature greater than 100.4 degrees Fahrenheit Foul-smelling vaginal discharge Difficulty urinating Nausea and vomiting Increased pain at the site of the incision that is unrelieved with pain medicine. Difficulty breathing with or without chest pain New calf pain especially if only on one side Sudden, continuing increased vaginal bleeding with or without clots.   Contacts: For questions or concerns you should contact:  Dr. Jeral Pinch at [phone]  Joylene John, NP at [phone]  After Hours: call [phone] and have the GYN Oncologist paged/contacted

## 2021-11-09 NOTE — Anesthesia Preprocedure Evaluation (Signed)
Anesthesia Evaluation  Patient identified by MRN, date of birth, ID band Patient awake    Reviewed: Allergy & Precautions, NPO status , Patient's Chart, lab work & pertinent test results  Airway Mallampati: II  TM Distance: >3 FB Neck ROM: Full    Dental  (+) Poor Dentition, Chipped, Dental Advisory Given,    Pulmonary asthma , former smoker,    Pulmonary exam normal breath sounds clear to auscultation       Cardiovascular hypertension, Pt. on medications Normal cardiovascular exam Rhythm:Regular Rate:Normal     Neuro/Psych negative neurological ROS  negative psych ROS   GI/Hepatic negative GI ROS, Neg liver ROS,   Endo/Other  negative endocrine ROS  Renal/GU negative Renal ROS  negative genitourinary   Musculoskeletal negative musculoskeletal ROS (+)   Abdominal   Peds negative pediatric ROS (+)  Hematology negative hematology ROS (+)   Anesthesia Other Findings   Reproductive/Obstetrics negative OB ROS                             Anesthesia Physical Anesthesia Plan  ASA: 2  Anesthesia Plan: General   Post-op Pain Management:    Induction: Intravenous  PONV Risk Score and Plan: 3 and Ondansetron, Dexamethasone, Treatment may vary due to age or medical condition and Midazolam  Airway Management Planned: Oral ETT  Additional Equipment:   Intra-op Plan:   Post-operative Plan: Extubation in OR  Informed Consent: I have reviewed the patients History and Physical, chart, labs and discussed the procedure including the risks, benefits and alternatives for the proposed anesthesia with the patient or authorized representative who has indicated his/her understanding and acceptance.     Dental advisory given  Plan Discussed with: CRNA and Surgeon  Anesthesia Plan Comments:         Anesthesia Quick Evaluation

## 2021-11-09 NOTE — Interval H&P Note (Signed)
History and Physical Interval Note:  11/09/2021 8:06 AM  Mary Hubbard  has presented today for surgery, with the diagnosis of complex atypical endometrial hyerplasia.  The various methods of treatment have been discussed with the patient and family. After consideration of risks, benefits and other options for treatment, the patient has consented to  Procedure(s): XI ROBOTIC ASSISTED TOTAL HYSTERECTOMY WITH BILATERAL SALPINGO OOPHORECTOMY possible, LAPAROTOMY (N/A) SENTINEL NODE BIOPSY (N/A) possible, LYMPH NODE DISSECTION (N/A) INDOCYANINE GREEN FLUORESCENCE IMAGING (ICG) (N/A) as a surgical intervention.  The patient's history has been reviewed, patient examined, no change in status, stable for surgery.  I have reviewed the patient's chart and labs.  Questions were answered to the patient's satisfaction.     Lafonda Mosses

## 2021-11-09 NOTE — Anesthesia Procedure Notes (Signed)
Procedure Name: Intubation Date/Time: 11/09/2021 8:37 AM Performed by: West Pugh, CRNA Pre-anesthesia Checklist: Patient identified, Emergency Drugs available, Suction available, Patient being monitored and Timeout performed Patient Re-evaluated:Patient Re-evaluated prior to induction Oxygen Delivery Method: Circle system utilized Preoxygenation: Pre-oxygenation with 100% oxygen Induction Type: IV induction Ventilation: Mask ventilation without difficulty and Oral airway inserted - appropriate to patient size Laryngoscope Size: Mac and 3 Grade View: Grade II Tube type: Oral Tube size: 7.0 mm Number of attempts: 1 Airway Equipment and Method: Stylet Placement Confirmation: ETT inserted through vocal cords under direct vision, positive ETCO2, CO2 detector and breath sounds checked- equal and bilateral Secured at: 22 cm Tube secured with: Tape Dental Injury: Teeth and Oropharynx as per pre-operative assessment  Comments: Poor dentition

## 2021-11-09 NOTE — Op Note (Signed)
OPERATIVE NOTE  Pre-operative Diagnosis: CAH  Post-operative Diagnosis: same, mild adhesive disease, left benign ovarian stromal tumor  Operation: Robotic-assisted laparoscopic total hysterectomy with bilateral salpingoophorectomy, SLN biopsy, lysis of adhesions   Surgeon: Jeral Pinch MD  Assistant Surgeon: Joylene John NP  Anesthesia: GET  Urine Output: 200cc  Operative Findings:  On EUA, 8-10 cm mobile uterus. On intra-abdominal entry, normal upper abdominal survey, minimal adhesions between the liver and diaphragm. Some adhesions of the omentum to the right anterior abdominal wall. Uterus 8-10cm and bulbous. Filmy adhesions between the ovaries and uterus and the sigmoid colon. Right adnexa normal appearing. Left tube normal appearing. Left ovary replaced by 3 cm solid appearing mass. Mapping successful to right external iliac SLN and left obturator SLN. Mild adhesions between bladder and cervix. No intra-abdominal or pelvic evidence of disease. 2cm fecalith noted in the pelvis, sent with the specimen. Frozen section of left ovary c/w benign ovarian mass.  Estimated Blood Loss:  100 cc      Total IV Fluids: see I&O flowsheet         Specimens: uterus, cervix, bilateral tubes and ovaries, right external SLN, left obturator SLN, suspected fecalith          Complications:  None apparent; patient tolerated the procedure well.         Disposition: PACU - hemodynamically stable.  Procedure Details  The patient was seen in the Holding Room. The risks, benefits, complications, treatment options, and expected outcomes were discussed with the patient.  The patient concurred with the proposed plan, giving informed consent.  The site of surgery properly noted/marked. The patient was identified as Mary Hubbard and the procedure verified as a Robotic-assisted hysterectomy with bilateral salpingo oophorectomy with SLN biopsy.   After induction of anesthesia, the patient was draped and prepped  in the usual sterile manner. Patient was placed in supine position after anesthesia and draped and prepped in the usual sterile manner as follows: Her arms were tucked to her side with all appropriate precautions.  The shoulders were stabilized with padded shoulder blocks applied to the acromium processes.  The patient was placed in the semi-lithotomy position in [place].  The perineum and vagina were prepped with CholoraPrep. The patient was draped after the CholoraPrep had been allowed to dry for 3 minutes.  A Time Out was held and the above information confirmed.  The urethra was prepped with Betadine. Foley catheter was placed.  A sterile speculum was placed in the vagina.  The cervix was grasped with a single-tooth tenaculum. 2mg  total of ICG was injected into the cervical stroma at 2 and 9 o'clock with 1cc injected at a 1cm and 62mm depth (concentration 0.5mg /ml) in all locations. The cervix was dilated with Kennon Rounds dilators.  The ZUMI uterine manipulator with a medium colpotomizer ring was placed without difficulty.  A pneum occluder balloon was placed over the manipulator.  OG tube placement was confirmed and to suction.   Next, a 10 mm skin incision was made 1 cm below the subcostal margin in the midclavicular line.  The 5 mm Optiview port and scope was used for direct entry.  Opening pressure was under 10 mm CO2.  The abdomen was insufflated and the findings were noted as above.   At this point and all points during the procedure, the patient's intra-abdominal pressure did not exceed 15 mmHg. Next, an 8 mm skin incision was made superior to the umbilicus and a right and left port were placed about  8 cm lateral to the robot port on the right and left side.  A fourth arm was placed on the right.  The 5 mm assist trocar was exchanged for a 10-12 mm port. All ports were placed under direct visualization.  The patient was placed in steep Trendelenburg.    Filmy adhesions of the omentum to the anterior  abdominal wall were lysed sharply. Bowel was folded away into the upper abdomen.  The robot was docked in the normal manner.  Filmy adhesions between adnexa/uterus and sigmoid were lysed sharply.  The right and left peritoneum were opened parallel to the IP ligament to open the retroperitoneal spaces bilaterally. The round ligaments were transected. The SLN mapping was performed in bilateral pelvic basins. After identifying the ureters, the para rectal and paravesical spaces were opened up entirely with careful dissection below the level of the ureters bilaterally and to the depth of the uterine artery origin in order to skeletonize the uterine "web" and ensure visualization of all parametrial channels. The para-aortic basins were carefully exposed and evaluated for isolated para-aortic SLN's. Lymphatic channels were identified travelling to the following visualized sentinel lymph node's: right external iliac and left obturator SLNs. These SLN's were separated from their surrounding lymphatic tissue, removed and sent for permanent pathology.  The hysterectomy was started.  The ureter was again noted to be on the medial leaf of the broad ligament.  The peritoneum above the ureter was incised and stretched and the infundibulopelvic ligament was skeletonized, cauterized and cut.  The posterior peritoneum was taken down to the level of the KOH ring.  The anterior peritoneum was also taken down.  The bladder flap was created to the level of the KOH ring.  The uterine artery on the right side was skeletonized, cauterized and cut in the normal manner.  A similar procedure was performed on the left.  The colpotomy was made and the uterus, cervix, bilateral ovaries and tubes were amputated and delivered through the vagina.  Pedicles were inspected and excellent hemostasis was achieved.    The colpotomy at the vaginal cuff was closed with Vicryl on a CT1 needle in a running manner.  Irrigation was used and excellent  hemostasis was achieved.  At this point in the procedure was completed.  Robotic instruments were removed under direct visulaization.  The robot was undocked. The fascia at the 10-12 mm port was closed with 0 Vicryl on a UR-5 needle.  The subcuticular tissue was closed with 4-0 Vicryl and the skin was closed with 4-0 Monocryl in a subcuticular manner.  Dermabond was applied.    The vagina was swabbed with bleeding noted. On speculum exam, 2cm left lateral vaginal sidewall tear was noted. This was repaired with running locking 2-0 Vicryl suture. Vagina was swabbed again with minimal bleeding noted.   All sponge, lap and needle counts were correct x  3.   The patient was transferred to the recovery room in stable condition.  Jeral Pinch, MD

## 2021-11-09 NOTE — Transfer of Care (Signed)
Immediate Anesthesia Transfer of Care Note  Patient: Mary Hubbard  Procedure(s) Performed: XI ROBOTIC ASSISTED TOTAL HYSTERECTOMY WITH BILATERAL SALPINGO OOPHORECTOMY SENTINEL NODE BIOPSY INDOCYANINE GREEN FLUORESCENCE IMAGING (ICG)  Patient Location: PACU  Anesthesia Type:General  Level of Consciousness: awake, drowsy and patient cooperative  Airway & Oxygen Therapy: Patient Spontanous Breathing and Patient connected to face mask oxygen  Post-op Assessment: Report given to RN and Post -op Vital signs reviewed and stable  Post vital signs: Reviewed and stable  Last Vitals:  Vitals Value Taken Time  BP 175/107 11/09/21 1116  Temp    Pulse 79 11/09/21 1120  Resp 18 11/09/21 1120  SpO2 100 % 11/09/21 1120  Vitals shown include unvalidated device data.  Last Pain:  Vitals:   11/09/21 0635  TempSrc: Oral         Complications: No notable events documented.

## 2021-11-09 NOTE — Anesthesia Postprocedure Evaluation (Signed)
Anesthesia Post Note  Patient: Mary Hubbard  Procedure(s) Performed: XI ROBOTIC ASSISTED TOTAL HYSTERECTOMY WITH BILATERAL SALPINGO OOPHORECTOMY SENTINEL NODE BIOPSY INDOCYANINE GREEN FLUORESCENCE IMAGING (ICG)     Patient location during evaluation: PACU Anesthesia Type: General Level of consciousness: awake and alert Pain management: pain level controlled Vital Signs Assessment: post-procedure vital signs reviewed and stable Respiratory status: spontaneous breathing, nonlabored ventilation, respiratory function stable and patient connected to nasal cannula oxygen Cardiovascular status: blood pressure returned to baseline and stable Postop Assessment: no apparent nausea or vomiting Anesthetic complications: no   No notable events documented.  Last Vitals:  Vitals:   11/09/21 1200 11/09/21 1215  BP: 134/69 (!) 135/53  Pulse: 88 88  Resp: 16 12  Temp:    SpO2: 96% 98%    Last Pain:  Vitals:   11/09/21 1215  TempSrc:   PainSc: 0-No pain                 Tynan Boesel S

## 2021-11-10 ENCOUNTER — Telehealth: Payer: Self-pay

## 2021-11-10 ENCOUNTER — Encounter (HOSPITAL_COMMUNITY): Payer: Self-pay | Admitting: Gynecologic Oncology

## 2021-11-10 NOTE — Telephone Encounter (Signed)
Spoke with Ms. Gikas's daughter this morning with assistance of pacific interpreter line (ID [phone]). She states she is eating, drinking and urinating well. She has had a decreased appetite. Advised to eat a lighter diet today and have smaller meals throughout the day.  She has not had a BM yet but is passing gas. She is taking senokot as prescribed and encouraged her to drink plenty of water. She denies fever or chills. Incisions are dry and intact. She rates her pain 8/10. She has been taking tramadol every  6 hours. Advised to alternate taking ibuprofen and tylenol every 6 hours and use tramadol as needed for pain not relieved by tylenol or ibuprofen. She verbalized understanding and states she has been using a hot water bottle on her abdomen and that has been helping as well.   Instructed to call office with any fever, chills, purulent drainage, uncontrolled pain, increased vaginal bleeding (like a period) or any other questions or concerns. She verbalizes understanding.   Pt aware of post op appointments as well as the office number [phone] and after hours number [phone] to call if she has any questions or concerns

## 2021-11-11 ENCOUNTER — Other Ambulatory Visit: Payer: Self-pay

## 2021-11-15 LAB — SURGICAL PATHOLOGY

## 2021-12-09 ENCOUNTER — Ambulatory Visit: Payer: Medicaid Other | Admitting: Gynecologic Oncology

## 2021-12-12 ENCOUNTER — Inpatient Hospital Stay: Payer: Medicaid Other | Attending: Gynecologic Oncology | Admitting: Gynecologic Oncology

## 2021-12-27 ENCOUNTER — Telehealth: Payer: Self-pay | Admitting: Oncology

## 2021-12-27 NOTE — Telephone Encounter (Signed)
Called Eye (daughter) and rescheduled post op appointment with Dr. Berline Lopes to 01/09/22 at 2:45.  Eye verbalized agreement and said Jamilett is doing very good.

## 2022-01-09 ENCOUNTER — Inpatient Hospital Stay: Payer: Medicaid Other | Attending: Gynecologic Oncology | Admitting: Gynecologic Oncology

## 2022-01-09 ENCOUNTER — Other Ambulatory Visit: Payer: Self-pay

## 2022-01-09 ENCOUNTER — Encounter: Payer: Self-pay | Admitting: Gynecologic Oncology

## 2022-01-09 VITALS — BP 161/99 | HR 108 | Temp 98.3°F | Resp 16 | Ht 62.01 in | Wt 207.2 lb

## 2022-01-09 DIAGNOSIS — Z90722 Acquired absence of ovaries, bilateral: Secondary | ICD-10-CM

## 2022-01-09 DIAGNOSIS — Z90710 Acquired absence of both cervix and uterus: Secondary | ICD-10-CM

## 2022-01-09 DIAGNOSIS — N8502 Endometrial intraepithelial neoplasia [EIN]: Secondary | ICD-10-CM

## 2022-01-09 DIAGNOSIS — Z7189 Other specified counseling: Secondary | ICD-10-CM

## 2022-01-09 NOTE — Progress Notes (Signed)
Gynecologic Oncology Return Clinic Visit ? ?01/09/2022 ? ?Reason for Visit: Follow-up after surgery, treatment planning ? ?Treatment History: ?09/29/2021: Endometrial biopsy shows at least atypical endometrial hyperplasia. ?11/09/2021: TRH/BSO, bilateral sentinel lymph node biopsy, lysis of adhesion. 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  Myometrial invasion 12%.  Lymphovascular invasion not identified.  Sentinel lymph nodes negative. ? ?Interval History: ?Patient presents today for follow-up after surgery.  She reports doing well.  Denies any abdominal or pelvic pain.  Incisions have healed.  Reports some constipation, uses MiraLAX intermittently.  Denies urinary symptoms, vaginal bleeding or discharge. ? ?Past Medical/Surgical History: ?Past Medical History:  ?Diagnosis Date  ? Asthma   ? denies hosp or intubations  ? BMI 36.0-36.9,adult   ? Hypertension   ? Joint pain   ? ? ?Past Surgical History:  ?Procedure Laterality Date  ? APPENDECTOMY    ? 20 years ago  ? ROBOTIC ASSISTED TOTAL HYSTERECTOMY WITH BILATERAL SALPINGO OOPHERECTOMY N/A 11/09/2021  ? Procedure: XI ROBOTIC ASSISTED TOTAL HYSTERECTOMY WITH BILATERAL SALPINGO OOPHORECTOMY;  Surgeon: Lafonda Mosses, MD;  Location: [facility];  Service: Gynecology;  Laterality: N/A;  ? SENTINEL NODE BIOPSY N/A 11/09/2021  ? Procedure: SENTINEL NODE BIOPSY;  Surgeon: Lafonda Mosses, MD;  Location: [facility];  Service: Gynecology;  Laterality: N/A;  ? ? ?History reviewed. No pertinent family history. ? ?Social History  ? ?Socioeconomic History  ? Marital status: Widowed  ?  Spouse name: Not on file  ? Number of children: Not on file  ? Years of education: Not on file  ? Highest education level: Not on file  ?Occupational History  ? Occupation: unemployed  ?  Employer: [employer]  ?Tobacco Use  ? Smoking status: Former  ?  Types: Cigarettes  ? Smokeless tobacco: Never  ?Vaping Use  ? Vaping Use: Never used  ?Substance and Sexual  Activity  ? Alcohol use: No  ? Drug use: No  ? Sexual activity: Not Currently  ?Other Topics Concern  ? Not on file  ?Social History Narrative  ? ** Merged History Encounter **  ?    ? ?Social Determinants of Health  ? ?Financial Resource Strain: Not on file  ?Food Insecurity: Not on file  ?Transportation Needs: Not on file  ?Physical Activity: Not on file  ?Stress: Not on file  ?Social Connections: Not on file  ? ? ?Current Medications: ? ?Current Outpatient Medications:  ?  acetaminophen (TYLENOL) 325 MG tablet, Take 2 tablets (650 mg total) by mouth every 6 (six) hours as needed for headache. (Patient not taking: Reported on 10/17/2021), Disp: 30 tablet, Rfl: 0 ?  amLODipine (NORVASC) 5 MG tablet, Take 1 tablet (5 mg total) by mouth daily. For blood pressure, Disp: 90 tablet, Rfl: 3 ?  atorvastatin (LIPITOR) 40 MG tablet, Take 40 mg by mouth at bedtime., Disp: , Rfl:  ?  cetirizine (ZYRTEC ALLERGY) 10 MG tablet, Take 1 tablet (10 mg total) by mouth daily for 10 days. (Patient not taking: Reported on 10/27/2021), Disp: 10 tablet, Rfl: 0 ?  fluticasone furoate-vilanterol (BREO ELLIPTA) 100-25 MCG/ACT AEPB, Inhale 1 puff into the lungs daily as needed (shortness of breath)., Disp: , Rfl:  ?  ibuprofen (ADVIL) 600 MG tablet, Take 1 tablet (600 mg total) by mouth every 6 (six) hours as needed for mild pain or moderate pain. For AFTER surgery only, Disp: 30 tablet, Rfl: 0 ?  senna-docusate (SENOKOT-S) 8.6-50 MG tablet, Take 2 tablets by mouth at bedtime. For  AFTER surgery, do not take if having diarrhea, Disp: 30 tablet, Rfl: 0 ?  traMADol (ULTRAM) 50 MG tablet, Take 1 tablet (50 mg total) by mouth every 6 (six) hours as needed for severe pain. For AFTER surgery only, do not take and drive, Disp: 10 tablet, Rfl: 0 ?  Vitamin D, Ergocalciferol, (DRISDOL) 1.25 MG (50000 UNIT) CAPS capsule, Take 50,000 Units by mouth once a week., Disp: , Rfl:  ? ?Review of Systems: ?Denies appetite changes, fevers, chills, fatigue,  unexplained weight changes. ?Denies hearing loss, neck lumps or masses, mouth sores, ringing in ears or voice changes. ?Denies cough or wheezing.  Denies shortness of breath. ?Denies chest pain or palpitations. Denies leg swelling. ?Denies abdominal distention, pain, blood in stools, diarrhea, nausea, vomiting, or early satiety. ?Denies pain with intercourse, dysuria, frequency, hematuria or incontinence. ?Denies hot flashes, pelvic pain, vaginal bleeding or vaginal discharge.   ?Denies joint pain, back pain or muscle pain/cramps. ?Denies itching, rash, or wounds. ?Denies dizziness, headaches, numbness or seizures. ?Denies swollen lymph nodes or glands, denies easy bruising or bleeding. ?Denies anxiety, depression, confusion, or decreased concentration. ? ?Physical Exam: ?BP (!) 161/99 (BP Location: Left Arm, Patient Position: Sitting)   Pulse (!) 108   Temp 98.3 ?F (36.8 ?C) (Oral)   Resp 16   Ht 5' 2.01" (1.575 m)   Wt 207 lb 3.2 oz (94 kg)   LMP  (LMP Unknown)   SpO2 98%   BMI 37.89 kg/m?  ?General: Alert, oriented, no acute distress. ?HEENT: Normocephalic, atraumatic, sclera anicteric. ?Chest: Clear to auscultation bilaterally.  Unlabored breathing on room air. ?Cardiovascular: Regular rate and rhythm, no murmurs. ?Abdomen: Obese, soft, nontender.  Normoactive bowel sounds.  No masses or hepatosplenomegaly appreciated.  Well-healed incisions. ?Extremities: Grossly normal range of motion.  Warm, well perfused.  No edema bilaterally. ?Skin: No rashes or lesions noted. ?GU: Normal appearing external genitalia without erythema, excoriation, or lesions.  Speculum exam reveals no bleeding or discharge, no vaginal lesions, cuff intact, no suture visible.  Bimanual exam reveals cuff intact, no fluctuance or tenderness with palpation.   ? ?Laboratory & Radiologic Studies: ?None new ? ?Assessment & Plan: ?Mary Hubbard is a 66 y.o. 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 ?Definitive surgery in 10/2021.  MS stable. ? ?Patient is overall doing well from surgery and recovered.  She is now approximately 2 months out from surgery and all restrictions are lifted. ? ?The patient was given a copy of her final pathology report, which we reviewed today. ? ?We reviewed signs and symptoms that would be concerning for cancer recurrence.  Given her low risk, early stage pathology, we discussed that per NCCN surveillance recommendations, she will have a surveillance visit to include symptom review and exam every 6 months for 5 years and then yearly after that.  I have recommended that we alternate 37-monthvisits between my office and her OB/GYN.  I stressed the importance of calling to see me sooner if she develops any new and concerning symptoms before her next visit. ? ?28 minutes of total time was spent for this patient encounter, including preparation, face-to-face counseling with the patient and coordination of care, and documentation of the encounter. ? ?KJeral Pinch MD  ?Division of Gynecologic Oncology  ?Department of Obstetrics and Gynecology  ?[facility] ? ?

## 2022-01-09 NOTE — Patient Instructions (Signed)
It was good to see you today.  You have healed very well from surgery. ? ?Given your diagnosis of uterine cancer, I recommend visits every 6 months alternating between your OB/GYN, Dr. Elly Modena, and my office.  Please reach out to see her in the fall.  Sometime after January 2024, please call my office to schedule a visit to see me back in March 2024. ? ?If you develop any concerning symptoms before your next visit, such as vaginal bleeding, pelvic pain, change to bowel function, or unintentional weight loss, please call to see me sooner. ?

## 2022-04-26 ENCOUNTER — Other Ambulatory Visit: Payer: Self-pay

## 2022-04-26 ENCOUNTER — Encounter (HOSPITAL_COMMUNITY): Payer: Self-pay | Admitting: Emergency Medicine

## 2022-04-26 ENCOUNTER — Emergency Department (HOSPITAL_COMMUNITY): Payer: Medicare Other

## 2022-04-26 ENCOUNTER — Emergency Department (HOSPITAL_COMMUNITY)
Admission: EM | Admit: 2022-04-26 | Discharge: 2022-04-27 | Disposition: A | Discharge status: Home / Self Care | Payer: Medicare Other | Attending: Emergency Medicine | Admitting: Emergency Medicine

## 2022-04-26 DIAGNOSIS — J45909 Unspecified asthma, uncomplicated: Secondary | ICD-10-CM | POA: Insufficient documentation

## 2022-04-26 DIAGNOSIS — R519 Headache, unspecified: Secondary | ICD-10-CM | POA: Diagnosis not present

## 2022-04-26 DIAGNOSIS — R1013 Epigastric pain: Secondary | ICD-10-CM | POA: Diagnosis present

## 2022-04-26 DIAGNOSIS — R0602 Shortness of breath: Secondary | ICD-10-CM | POA: Insufficient documentation

## 2022-04-26 DIAGNOSIS — Z79899 Other long term (current) drug therapy: Secondary | ICD-10-CM | POA: Diagnosis not present

## 2022-04-26 DIAGNOSIS — R42 Dizziness and giddiness: Secondary | ICD-10-CM | POA: Insufficient documentation

## 2022-04-26 DIAGNOSIS — R5383 Other fatigue: Secondary | ICD-10-CM | POA: Insufficient documentation

## 2022-04-26 DIAGNOSIS — Z7951 Long term (current) use of inhaled steroids: Secondary | ICD-10-CM | POA: Insufficient documentation

## 2022-04-26 DIAGNOSIS — I158 Other secondary hypertension: Secondary | ICD-10-CM | POA: Insufficient documentation

## 2022-04-26 DIAGNOSIS — R110 Nausea: Secondary | ICD-10-CM | POA: Insufficient documentation

## 2022-04-26 LAB — COMPREHENSIVE METABOLIC PANEL
ALT: 48 U/L — ABNORMAL HIGH (ref 0–44)
AST: 31 U/L (ref 15–41)
Albumin: 4 g/dL (ref 3.5–5.0)
Alkaline Phosphatase: 73 U/L (ref 38–126)
Anion gap: 7 (ref 5–15)
BUN: 24 mg/dL — ABNORMAL HIGH (ref 8–23)
CO2: 26 mmol/L (ref 22–32)
Calcium: 9.4 mg/dL (ref 8.9–10.3)
Chloride: 108 mmol/L (ref 98–111)
Creatinine, Ser: 0.84 mg/dL (ref 0.44–1.00)
GFR, Estimated: >60 mL/min (ref >60)
Glucose, Bld: 133 mg/dL — ABNORMAL HIGH (ref 70–99)
Potassium: 3.6 mmol/L (ref 3.5–5.1)
Sodium: 141 mmol/L (ref 135–145)
Total Bilirubin: 0.5 mg/dL (ref 0.3–1.2)
Total Protein: 7.5 g/dL (ref 6.5–8.1)

## 2022-04-26 LAB — CBC
HCT: 39 % (ref 36.0–46.0)
Hemoglobin: 12.7 g/dL (ref 12.0–15.0)
MCH: 30.2 pg (ref 26.0–34.0)
MCHC: 32.6 g/dL (ref 30.0–36.0)
MCV: 92.9 fL (ref 80.0–100.0)
Platelets: 206 10*3/uL (ref 150–400)
RBC: 4.2 MIL/uL (ref 3.87–5.11)
RDW: 12.7 % (ref 11.5–15.5)
WBC: 8.8 10*3/uL (ref 4.0–10.5)
nRBC: 0 % (ref 0.0–0.2)

## 2022-04-26 LAB — TROPONIN I (HIGH SENSITIVITY): Troponin I (High Sensitivity): 4 ng/L (ref <18)

## 2022-04-26 LAB — LIPASE, BLOOD: Lipase: 26 U/L (ref 11–51)

## 2022-04-26 NOTE — ED Provider Triage Note (Signed)
Emergency Medicine Provider Triage Evaluation Note  Mary Hubbard , a 66 y.o. female  was evaluated in triage.  Pt complains of epigastric abdominal pain and shortness of breath onset around 3:30 PM today.  Appears fairly comfortable on exam.  Denies chest pain.  Without abdominal tenderness on exam.  Review of Systems  Positive: As above Negative: As above  Physical Exam  BP (!) 184/127 (BP Location: Left Arm)   Pulse 100   Temp 98.6 F (37 C) (Oral)   Resp 20   LMP  (LMP Unknown)   SpO2 93%  Gen:   Awake, no distress   Resp:  Normal effort  MSK:   Moves extremities without difficulty Other:    Medical Decision Making  Medically screening exam initiated at 9:59 PM.  Appropriate orders placed.  Jasimine Zanders was informed that the remainder of the evaluation will be completed by another provider, this initial triage assessment does not replace that evaluation, and the importance of remaining in the ED until their evaluation is complete.     Evlyn Courier, PA-C 04/26/22 2159

## 2022-04-26 NOTE — ED Triage Notes (Addendum)
Patient presents with elevated BP, malaise, fatigue, shortness of breath, dizziness, abdominal pain and headache. Symptoms began 2 days ago. She has had these episodes before which also caused her to go to the hospital.

## 2022-04-27 DIAGNOSIS — R1013 Epigastric pain: Secondary | ICD-10-CM | POA: Diagnosis not present

## 2022-04-27 LAB — URINALYSIS, ROUTINE W REFLEX MICROSCOPIC
Bacteria, UA: NONE SEEN
Bilirubin Urine: NEGATIVE
Glucose, UA: NEGATIVE mg/dL
Hgb urine dipstick: NEGATIVE
Ketones, ur: NEGATIVE mg/dL
Nitrite: NEGATIVE
Protein, ur: NEGATIVE mg/dL
Specific Gravity, Urine: 1.014 (ref 1.005–1.030)
pH: 6 (ref 5.0–8.0)

## 2022-04-27 LAB — TROPONIN I (HIGH SENSITIVITY): Troponin I (High Sensitivity): 4 ng/L (ref <18)

## 2022-04-27 MED ORDER — ALUM & MAG HYDROXIDE-SIMETH 200-200-20 MG/5ML PO SUSP
30.0000 mL | Freq: Once | ORAL | Status: AC
  Administered 2022-04-27: 30 mL via ORAL
  Filled 2022-04-27: qty 30

## 2022-04-27 MED ORDER — LIDOCAINE VISCOUS HCL 2 % MT SOLN
15.0000 mL | Freq: Once | OROMUCOSAL | Status: AC
  Administered 2022-04-27: 15 mL via ORAL
  Filled 2022-04-27: qty 15

## 2022-04-27 NOTE — ED Provider Notes (Signed)
[facility] DEPT Provider Note  CSN: [id number] Arrival date & time: 04/26/22 2105  Chief Complaint(s) Abdominal Pain, Hypertension, Shortness of Breath, Headache, and Fatigue  HPI Mary Hubbard is a 66 y.o. female    The history is provided by the patient. The history is limited by a language barrier. A language interpreter was used.  Hypertension This is a chronic problem. Episode onset: 3 yrs. The problem has not changed since onset.Associated symptoms include abdominal pain. Pertinent negatives include no chest pain. Nothing aggravates the symptoms. Nothing relieves the symptoms. Treatments tried: '5mg'$  amlodipine.   Supposed to take 7.'5mg'$  of amlodipine.   Also complaining of lightheadedness and nausea when BP is elevated. Has epigastric discomfort. No emesis. No chest pain or SOB.  Past Medical History Past Medical History:  Diagnosis Date   Asthma    denies hosp or intubations   BMI 36.0-36.9,adult    Hypertension    Joint pain    Patient Active Problem List   Diagnosis Date Noted   Atypical endometrial hyperplasia 10/17/2021   BMI 36.0-36.9,adult 10/17/2021   Asthma due to seasonal allergies 04/20/2014   HTN (hypertension) 04/20/2014   Preventative health care 04/20/2014   Home Medication(s) Prior to Admission medications   Medication Sig Start Date End Date Taking? Authorizing Provider  acetaminophen (TYLENOL) 325 MG tablet Take 2 tablets (650 mg total) by mouth every 6 (six) hours as needed for headache. Patient not taking: Reported on 10/17/2021 07/07/21   Carmin Muskrat, MD  amLODipine (NORVASC) 5 MG tablet Take 1 tablet (5 mg total) by mouth daily. For blood pressure 12/14/16   Robyn Haber, MD  atorvastatin (LIPITOR) 40 MG tablet Take 40 mg by mouth at bedtime.    [provider]  cetirizine (ZYRTEC ALLERGY) 10 MG tablet Take 1 tablet (10 mg total) by mouth daily for 10 days. Patient not taking: Reported on 10/27/2021 07/07/21  10/27/21  Carmin Muskrat, MD  fluticasone furoate-vilanterol (BREO ELLIPTA) 100-25 MCG/ACT AEPB Inhale 1 puff into the lungs daily as needed (shortness of breath). 09/21/21   [provider]  ibuprofen (ADVIL) 600 MG tablet Take 1 tablet (600 mg total) by mouth every 6 (six) hours as needed for mild pain or moderate pain. For AFTER surgery only 11/09/21   Cross, Lenna Sciara D, NP  senna-docusate (SENOKOT-S) 8.6-50 MG tablet Take 2 tablets by mouth at bedtime. For AFTER surgery, do not take if having diarrhea 11/09/21   Cross, Lenna Sciara D, NP  traMADol (ULTRAM) 50 MG tablet Take 1 tablet (50 mg total) by mouth every 6 (six) hours as needed for severe pain. For AFTER surgery only, do not take and drive 0/92/33   Cross, Lenna Sciara D, NP  Vitamin D, Ergocalciferol, (DRISDOL) 1.25 MG (50000 UNIT) CAPS capsule Take 50,000 Units by mouth once a week. 08/25/21   [provider]  Allergies Patient has no known allergies.  Review of Systems Review of Systems  Cardiovascular:  Negative for chest pain.  Gastrointestinal:  Positive for abdominal pain.   As noted in HPI  Physical Exam Vital Signs  I have reviewed the triage vital signs BP (!) 159/102   Pulse 87   Temp 98.6 F (37 C) (Oral)   Resp (!) 23   LMP  (LMP Unknown)   SpO2 96%   Physical Exam Vitals reviewed.  Constitutional:      General: She is not in acute distress.    Appearance: She is well-developed. She is obese. She is not diaphoretic.  HENT:     Head: Normocephalic and atraumatic.     Right Ear: External ear normal.     Left Ear: External ear normal.     Nose: Nose normal.  Eyes:     General: No scleral icterus.       Right eye: No discharge.        Left eye: No discharge.     Conjunctiva/sclera: Conjunctivae normal.     Pupils: Pupils are equal, round, and reactive to light.  Neck:      Trachea: Phonation normal.  Cardiovascular:     Rate and Rhythm: Normal rate and regular rhythm.     Heart sounds: No murmur heard.    No friction rub. No gallop.  Pulmonary:     Effort: Pulmonary effort is normal. No respiratory distress.     Breath sounds: Normal breath sounds. No stridor. No rales.  Abdominal:     General: There is no distension.     Palpations: Abdomen is soft.     Tenderness: There is abdominal tenderness in the epigastric area and left upper quadrant. There is no guarding or rebound.  Musculoskeletal:        General: No tenderness. Normal range of motion.     Cervical back: Normal range of motion and neck supple.  Skin:    General: Skin is warm and dry.     Findings: No erythema or rash.  Neurological:     Mental Status: She is alert and oriented to person, place, and time.  Psychiatric:        Behavior: Behavior normal.     ED Results and Treatments Labs (all labs ordered are listed, but only abnormal results are displayed) Labs Reviewed  COMPREHENSIVE METABOLIC PANEL - Abnormal; Notable for the following components:      Result Value   Glucose, Bld 133 (*)    BUN 24 (*)    ALT 48 (*)    All other components within normal limits  URINALYSIS, ROUTINE W REFLEX MICROSCOPIC - Abnormal; Notable for the following components:   Color, Urine STRAW (*)    Leukocytes,Ua MODERATE (*)    All other components within normal limits  LIPASE, BLOOD  CBC  TROPONIN I (HIGH SENSITIVITY)  TROPONIN I (HIGH SENSITIVITY)  EKG  EKG Interpretation  Date/Time:  Wednesday April 26 2022 21:52:24 EDT Ventricular Rate:  100 PR Interval:  182 QRS Duration: 100 QT Interval:  363 QTC Calculation: 469 R Axis:   9 Text Interpretation: Sinus tachycardia RSR' in V1 or V2, right VCD or RVH 12 Lead; Mason-Likar Confirmed by Addison Lank [phone]) on  04/27/2022 3:30:23 AM       Radiology DG Chest 2 View  Result Date: 04/26/2022 CLINICAL DATA:  Shortness of breath. Elevated blood pressure. Malaise and weakness. EXAM: CHEST - 2 VIEW COMPARISON:  04/19/2010 FINDINGS: Shallow inspiration with slight infiltration or atelectasis in the lung bases. Heart size and pulmonary vascularity are normal. No pleural effusions. No pneumothorax. Mediastinal contours appear intact. Possible lap band in the left upper quadrant. Correlate with surgical history. IMPRESSION: Shallow inspiration with atelectasis or infiltration in the lung bases. Electronically Signed   By: Lucienne Capers M.D.   On: 04/26/2022 22:35    Pertinent labs & imaging results that were available during my care of the patient were reviewed by me and considered in my medical decision making (see MDM for details).  Medications Ordered in ED Medications  alum & mag hydroxide-simeth (MAALOX/MYLANTA) 200-200-20 MG/5ML suspension 30 mL (30 mLs Oral Given 04/27/22 0340)    And  lidocaine (XYLOCAINE) 2 % viscous mouth solution 15 mL (15 mLs Oral Given 04/27/22 0340)                                                                                                                                     Procedures Procedures  (including critical care time)  Medical Decision Making / ED Course    Complexity of Problem:  Co-morbidities/SDOH that complicate the patient evaluation/care: Language barrier, interpreter used  Patient's presenting problem/concern, DDX, and MDM listed below: HTN With lightheadedness and nausea On going for more than 3 yrs. Epigastric pain Mild discomfort to palpation We will assess for biliary obstruction and pancreatitis I have low suspicion for this.  Likely gastritis. Less likely cardiac etiology but will obtain cardiac work-up given age and comorbidities.    Complexity of Data:   Cardiac Monitoring: The patient was maintained on a cardiac monitor.   I  personally viewed and interpreted the cardiac monitored which showed an underlying rhythm of normal sinus rhythm EKG without acute ischemic changes or evidence of pericarditis  Laboratory Tests ordered listed below with my independent interpretation: CBC without leukocytosis or anemia Metabolic panel without significant electrolyte derangements or renal sufficiency No evidence of bili obstruction or pancreatitis Troponins negative x2 UA obtained in triage and notable for leukocytes but patient is denying any urinary symptoms concerning for UTI   Imaging Studies ordered listed below with my independent interpretation: Chest x-ray without evidence of pneumonia, pneumothorax or pulmonary edema     ED Course:    Assessment, Add'l Intervention, and Reassessment: Hypertension with lightheadedness and nausea No endorgan damage. Patient  not adequately taking blood pressure meds.  Educated on actual dose she is supposed to take.  Epigastric pain Work-up reassuring Likely gastritis Given GI cocktail  Final Clinical Impression(s) / ED Diagnoses Final diagnoses:  Other secondary hypertension  Lightheadedness  Epigastric discomfort   The patient appears reasonably screened and/or stabilized for discharge and I doubt any other medical condition or other [facility] requiring further screening, evaluation, or treatment in the ED at this time prior to discharge. Safe for discharge with strict return precautions.  Disposition: Discharge  Condition: Good  I have discussed the results, Dx and Tx plan with the patient/family who expressed understanding and agree(s) with the plan. Discharge instructions discussed at length. The patient/family was given strict return precautions who verbalized understanding of the instructions. No further questions at time of discharge.    ED Discharge Orders     None        Follow Up: Dulce Sellar, MD [place]  [address] [phone]  Call  to schedule an appointment for close follow up           This chart was dictated using voice recognition software.  Despite best efforts to proofread,  errors can occur which can change the documentation meaning.    Fatima Blank, MD 04/27/22 [phone]

## 2023-05-28 ENCOUNTER — Other Ambulatory Visit (HOSPITAL_COMMUNITY): Payer: Self-pay

## 2023-05-31 IMAGING — MG MM DIGITAL SCREENING BILAT W/ TOMO AND CAD
8 series · 8 of 24 positions shown · non-contrast
Comparison: Previous exam(s).

CLINICAL DATA: Screening.

EXAM:
DIGITAL SCREENING BILATERAL MAMMOGRAM WITH TOMOSYNTHESIS AND CAD
TECHNIQUE: Bilateral screening digital craniocaudal and mediolateral oblique
mammograms were obtained. Bilateral screening digital breast
tomosynthesis was performed. The images were evaluated with
computer-aided detection.

[R MLO synth-2D]
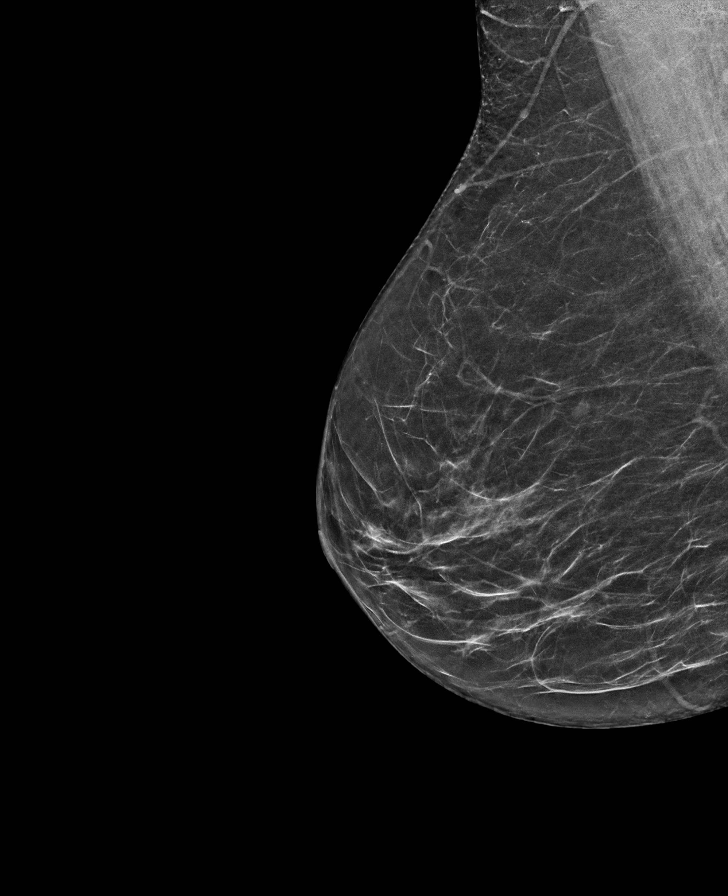

[L MLO synth-2D]
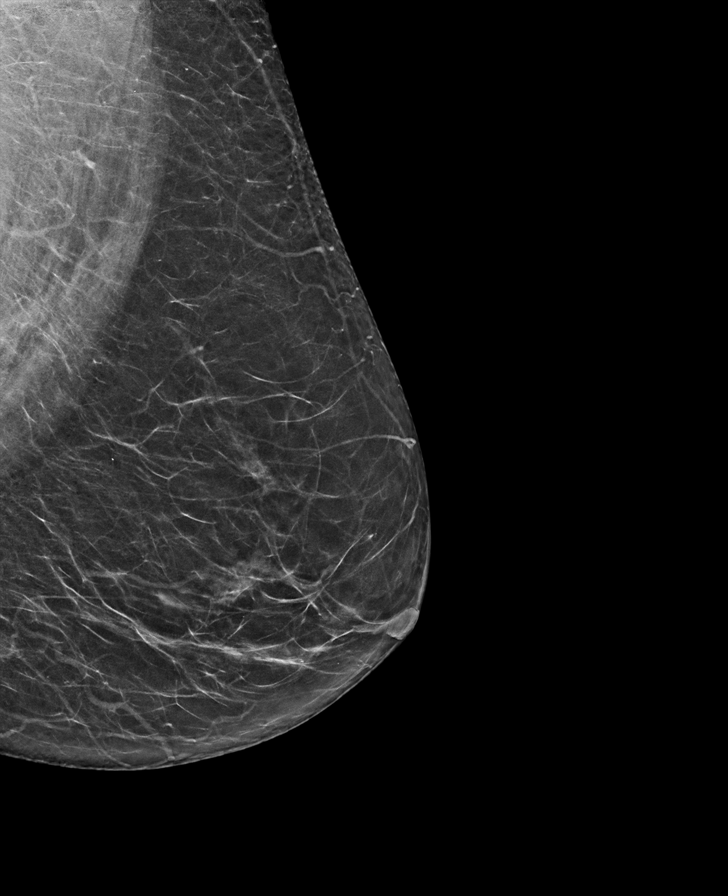

[R CC synth-2D]
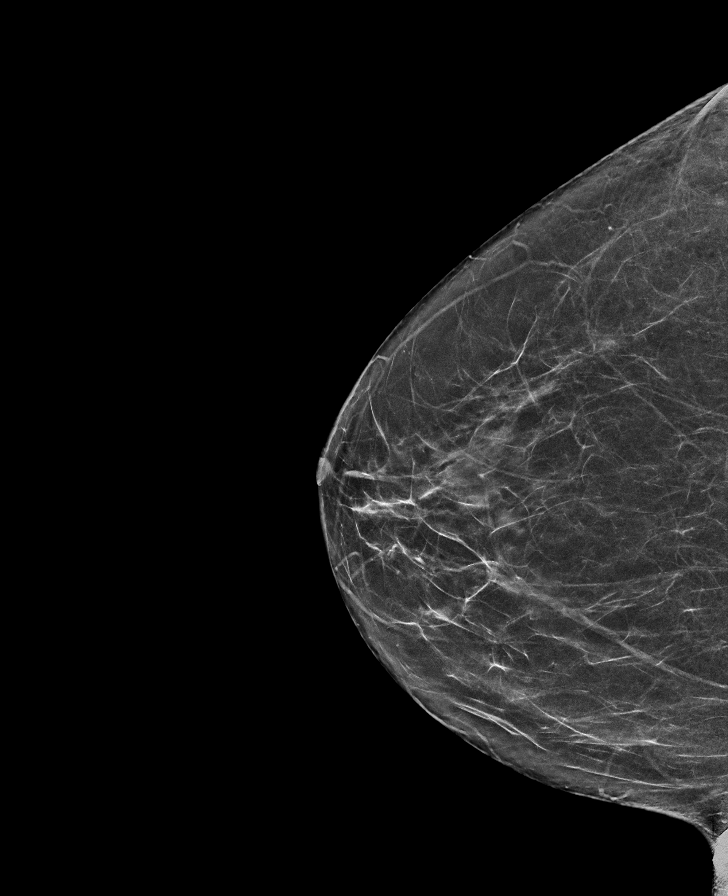

[L CC synth-2D]
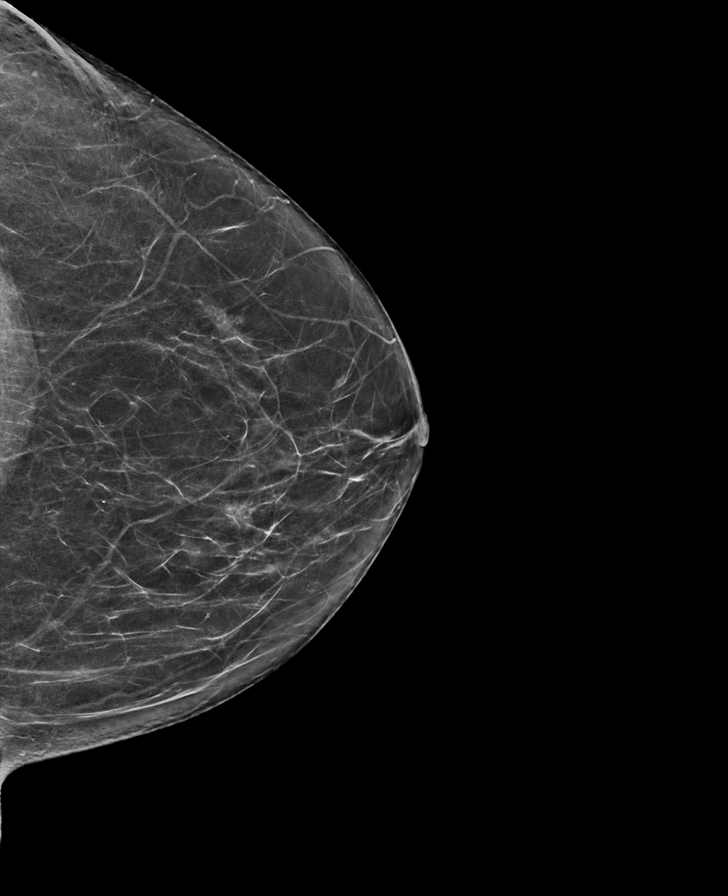

[R MLO tomo · tomo slice 33/64.0]
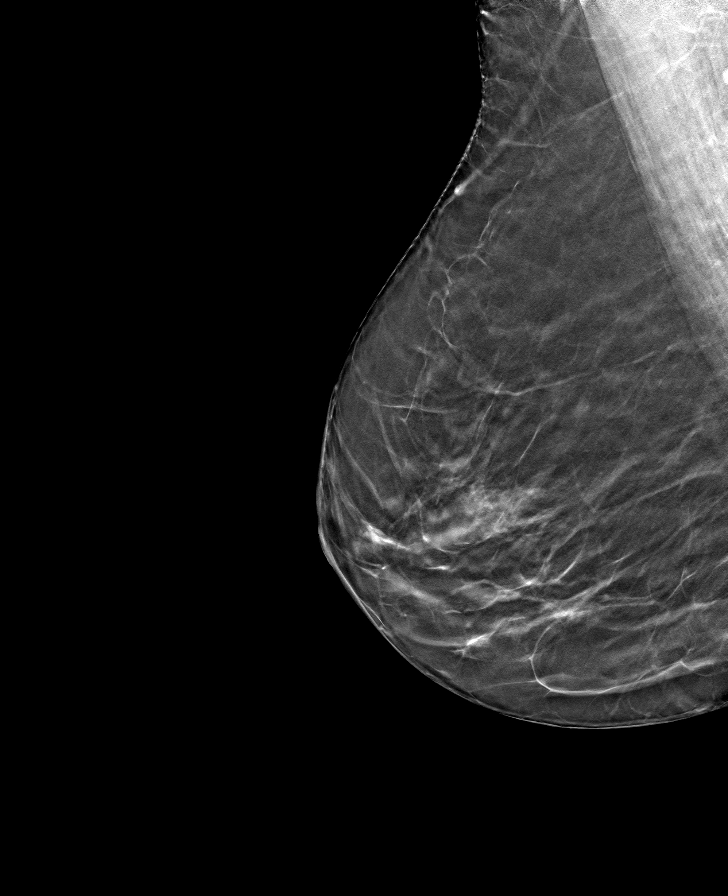

[L CC tomo · tomo slice 33/66.0]
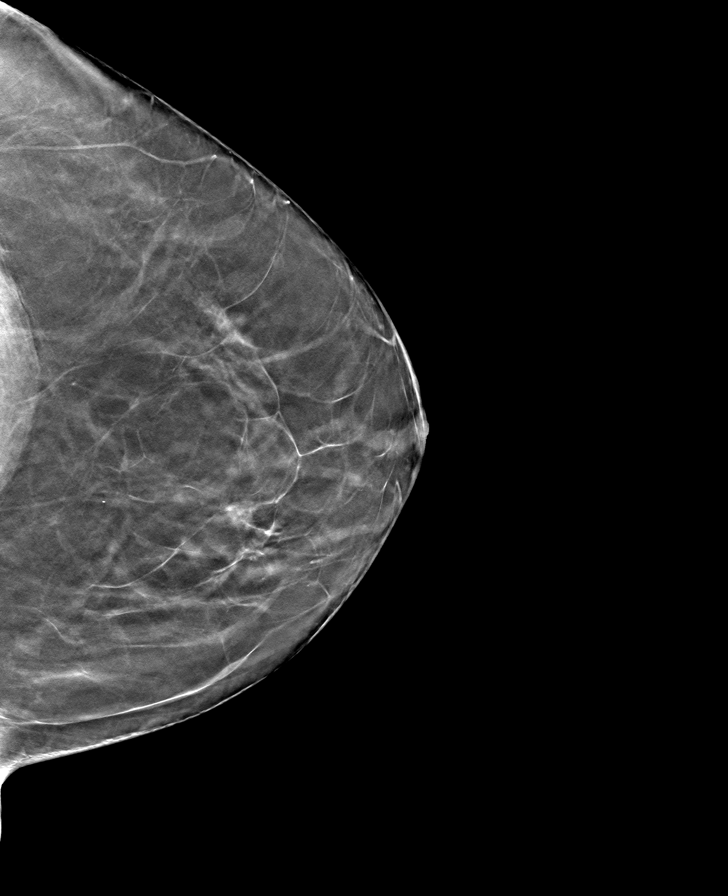

[R CC tomo · tomo slice 32/63.0]
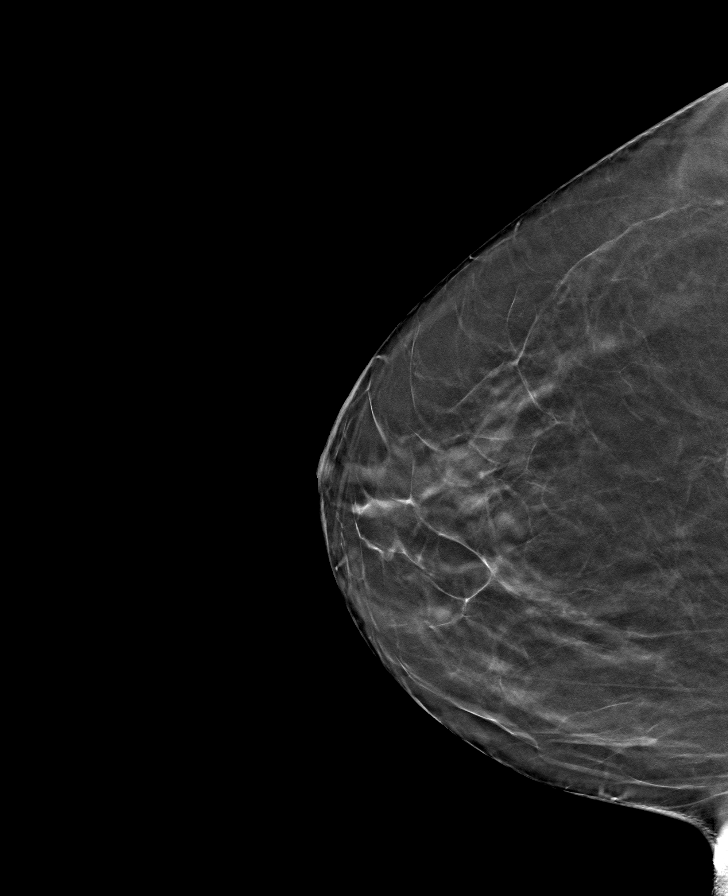

[L MLO tomo · tomo slice 33/66.0]
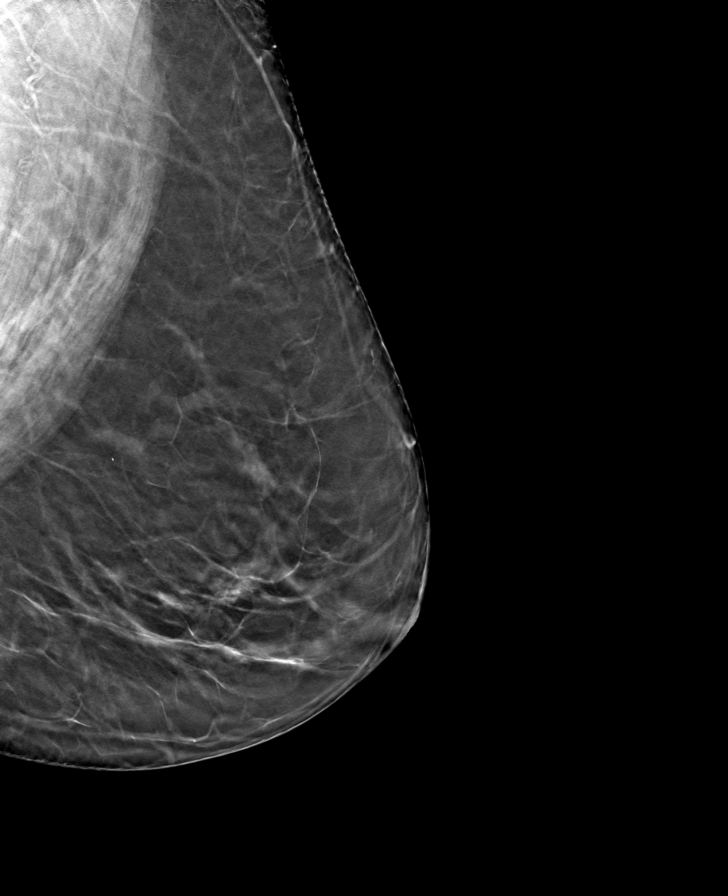

[8 of 24 positions shown; findings below may reference images not displayed]

ACR Breast Density Category b: There are scattered areas of
fibroglandular density.
FINDINGS: There are no findings suspicious for malignancy.
IMPRESSION: No mammographic evidence of malignancy. A result letter of this
screening mammogram will be mailed directly to the patient.

RECOMMENDATION:
Screening mammogram in one year. (Code:58-J-TOI)

BI-RADS CATEGORY  1: Negative.

**Please Insert Correct Screening Template

## 2024-06-26 ENCOUNTER — Other Ambulatory Visit: Payer: Self-pay | Admitting: Adult Medicine

## 2024-06-26 DIAGNOSIS — Z1231 Encounter for screening mammogram for malignant neoplasm of breast: Secondary | ICD-10-CM

## 2024-09-11 ENCOUNTER — Telehealth: Payer: Self-pay | Admitting: Oncology

## 2024-09-11 ENCOUNTER — Inpatient Hospital Stay: Admitting: Oncology

## 2024-09-11 ENCOUNTER — Inpatient Hospital Stay

## 2024-09-11 NOTE — Telephone Encounter (Signed)
 I spoke with patient and daughter to reschedule missed appointments on 09/11/2024 to 09/17/2024. They are aware of date/time.

## 2024-09-17 ENCOUNTER — Inpatient Hospital Stay: Attending: Oncology | Admitting: Oncology

## 2024-09-17 ENCOUNTER — Inpatient Hospital Stay

## 2024-09-17 VITALS — BP 115/80 | HR 96 | Temp 97.8°F | Resp 18 | Ht 62.01 in | Wt 205.9 lb

## 2024-09-17 DIAGNOSIS — D649 Anemia, unspecified: Secondary | ICD-10-CM | POA: Diagnosis present

## 2024-09-17 LAB — CBC WITH DIFFERENTIAL (CANCER CENTER ONLY)
Abs Immature Granulocytes: 0.03 K/uL (ref 0.00–0.07)
Basophils Absolute: 0.1 K/uL (ref 0.0–0.1)
Basophils Relative: 1 %
Eosinophils Absolute: 0.2 K/uL (ref 0.0–0.5)
Eosinophils Relative: 2 %
HCT: 39.5 % (ref 36.0–46.0)
Hemoglobin: 12.6 g/dL (ref 12.0–15.0)
Immature Granulocytes: 0 %
Lymphocytes Relative: 25 %
Lymphs Abs: 2.2 K/uL (ref 0.7–4.0)
MCH: 28 pg (ref 26.0–34.0)
MCHC: 31.9 g/dL (ref 30.0–36.0)
MCV: 87.8 fL (ref 80.0–100.0)
Monocytes Absolute: 0.7 K/uL (ref 0.1–1.0)
Monocytes Relative: 8 %
Neutro Abs: 5.8 K/uL (ref 1.7–7.7)
Neutrophils Relative %: 64 %
Platelet Count: 237 K/uL (ref 150–400)
RBC: 4.5 MIL/uL (ref 3.87–5.11)
RDW: 14.4 % (ref 11.5–15.5)
WBC Count: 9 K/uL (ref 4.0–10.5)
nRBC: 0 % (ref 0.0–0.2)

## 2024-09-17 LAB — CMP (CANCER CENTER ONLY)
ALT: 30 U/L (ref 0–44)
AST: 20 U/L (ref 15–41)
Albumin: 4.4 g/dL (ref 3.5–5.0)
Alkaline Phosphatase: 116 U/L (ref 38–126)
Anion gap: 11 (ref 5–15)
BUN: 18 mg/dL (ref 8–23)
CO2: 28 mmol/L (ref 22–32)
Calcium: 10 mg/dL (ref 8.9–10.3)
Chloride: 103 mmol/L (ref 98–111)
Creatinine: 0.86 mg/dL (ref 0.44–1.00)
GFR, Estimated: >60 mL/min (ref >60)
Glucose, Bld: 89 mg/dL (ref 70–99)
Potassium: 3.7 mmol/L (ref 3.5–5.1)
Sodium: 142 mmol/L (ref 135–145)
Total Bilirubin: 0.6 mg/dL (ref 0.0–1.2)
Total Protein: 7.6 g/dL (ref 6.5–8.1)

## 2024-09-17 LAB — IRON AND IRON BINDING CAPACITY (CC-WL,HP ONLY)
Iron: 76 ug/dL (ref 28–170)
Saturation Ratios: 18 % (ref 10.4–31.8)
TIBC: 414 ug/dL (ref 250–450)
UIBC: 338 ug/dL

## 2024-09-17 LAB — VITAMIN B12: Vitamin B-12: 807 pg/mL (ref 180–914)

## 2024-09-17 LAB — FOLATE: Folate: 6.3 ng/mL (ref >5.9)

## 2024-09-17 LAB — LACTATE DEHYDROGENASE: LDH: 178 U/L (ref 105–235)

## 2024-09-17 LAB — DIRECT ANTIGLOBULIN TEST (NOT AT ARMC)
DAT, IgG: NEGATIVE
DAT, complement: NEGATIVE

## 2024-09-17 LAB — TSH: TSH: 3.7 u[IU]/mL (ref 0.350–4.500)

## 2024-09-17 LAB — FERRITIN: Ferritin: 59 ng/mL (ref 11–307)

## 2024-09-17 MED ORDER — FOLIC ACID 1 MG PO TABS: 1.0000 mg | ORAL_TABLET | Freq: Every day | ORAL | 2 refills | Status: AC

## 2024-09-17 NOTE — Progress Notes (Signed)
 [facility]  HEMATOLOGY CLINIC CONSULTATION NOTE   PATIENT NAME: Mary Hubbard   MR#: [id number] DOB: 05-15-1956  DATE OF SERVICE: 09/17/2024  Patient Care Team: Kahoano, Haku K, MD as PCP - General (General Practice)  REASON FOR CONSULTATION/ CHIEF COMPLAINT:  Evaluation of anemia.  ASSESSMENT & PLAN:   Mary Hubbard is a 68 y.o. lady with a past medical history of hypertension, asthma, dyslipidemia, was referred to our service for evaluation of normocytic anemia.    Normocytic anemia On 06/09/2024, labs showed hemoglobin of 10.4, hematocrit 35.3, MCV 98.  White count 9100 with normal differential, platelet count 230,000.  Creatinine 0.9, unremarkable CMP.  Previously folate was decreased at 5.16 in May 2025.  B12 was normal at 496.  Methylmalonic acid was also normal at 146.  Hemoglobin was 11.6 at that time with normal MCV of 99.  She was referred to us  for further evaluation of normocytic anemia.  No significant symptoms such as chest pain or dyspnea at rest, but experiences dyspnea with exertion. No evidence of blood loss. Previous labs indicated low folate levels. No current multivitamin supplementation. Plan to address folate deficiency with supplementation.  - Prescribed folic acid  supplement, 1 mg once daily.  Labs today actually showed normal hemoglobin of 12.6, MCV normal at 87.8.  White count and platelet count are within normal limits.  CMP grossly unremarkable.  Iron studies currently show no evidence of iron deficiency.  B12 and folate are also within normal limits.  Haptoglobin and Coombs test pending.  - Scheduled follow-up appointment in three months with repeat labs.  If stable blood counts, she can be discharged from our office after next visit.   I reviewed lab results and outside records for this visit and discussed relevant results with the patient. Diagnosis, plan of care and treatment options were also discussed in detail with the patient.  Opportunity provided to ask questions and answers provided to her apparent satisfaction. Provided instructions to call our clinic with any problems, questions or concerns prior to return visit. I recommended to continue follow-up with PCP and sub-specialists. She verbalized understanding and agreed with the plan. No barriers to learning was detected.  Mary Janowiak, MD [facility] - A DEPT OF [facility] [address] [place] [address] Dept: [phone] Dept Fax: [phone]  09/17/2024 11:15 AM  HISTORY OF PRESENT ILLNESS:  Discussed the use of AI scribe software for clinical note transcription with the patient, who gave verbal consent to proceed.  Interview conducted with the help of an interpreter as she speaks a language called Darice.  History of Present Illness Mary Hubbard is a 68 year old female who presents with anemia. She was referred by her primary doctor for evaluation of anemia.  On 06/09/2024, labs showed hemoglobin of 10.4, hematocrit 35.3, MCV 98.  White count 9100 with normal differential, platelet count 230,000.  Creatinine 0.9, unremarkable CMP.  Previously folate was decreased at 5.16 in May 2025.  B12 was normal at 496.  Methylmalonic acid was also normal at 146.  Hemoglobin was 11.6 at that time with normal MCV of 99.  She was referred to us  for further evaluation of normocytic anemia.  She has a history of anemia, with a past instance of requiring a blood transfusion approximately 30 years ago [place]. She does not recall any recent history of anemia or being informed about it in the  past.  She denies any recent blood transfusions or iron infusions. She experiences shortness of breath during physical activities such as walking or exercising, but no chest pain or trouble breathing at rest. No history of asthma, although she mentions being told she has asthma when she drinks cold water   and coughs.  No symptoms of blood loss, such as blood in stools or black stools. Her hemoglobin levels were noted to be 11.6 g/dL in May 2025 and decreased to 10.4 g/dL in August 7974.  She does not take any multivitamin supplements and reports feeling that her energy levels are adequate, attributing any decrease to aging.   MEDICAL HISTORY:  Past Medical History:  Diagnosis Date   Asthma    denies hosp or intubations   BMI 36.0-36.9,adult    Hypertension    Joint pain     SURGICAL HISTORY: Past Surgical History:  Procedure Laterality Date   APPENDECTOMY     20 years ago   ROBOTIC ASSISTED TOTAL HYSTERECTOMY WITH BILATERAL SALPINGO OOPHERECTOMY N/A 11/09/2021   Procedure: XI ROBOTIC ASSISTED TOTAL HYSTERECTOMY WITH BILATERAL SALPINGO OOPHORECTOMY;  Surgeon: Viktoria Comer SAUNDERS, MD;  Location: [facility];  Service: Gynecology;  Laterality: N/A;   SENTINEL NODE BIOPSY N/A 11/09/2021   Procedure: SENTINEL NODE BIOPSY;  Surgeon: Viktoria Comer SAUNDERS, MD;  Location: [facility];  Service: Gynecology;  Laterality: N/A;    SOCIAL HISTORY: She reports that she has quit smoking. Her smoking use included cigarettes. She has never used smokeless tobacco. She reports that she does not drink alcohol and does not use drugs. Social History   Socioeconomic History   Marital status: Widowed    Spouse name: Not on file   Number of children: Not on file   Years of education: Not on file   Highest education level: Not on file  Occupational History   Occupation: unemployed    Employer: [employer]  Tobacco Use   Smoking status: Former    Types: Cigarettes   Smokeless tobacco: Never  Vaping Use   Vaping status: Never Used  Substance and Sexual Activity   Alcohol use: No   Drug use: No   Sexual activity: Not Currently  Other Topics Concern   Not on file  Social History Narrative   ** Merged History Encounter **       Social Drivers of Corporate Investment Banker Strain: Not on file  Food  Insecurity: Not on file  Transportation Needs: Not on file  Physical Activity: Not on file  Stress: Not on file  Social Connections: Not on file  Intimate Partner Violence: Not on file    FAMILY HISTORY: No family history on file.  ALLERGIES:  She has no known allergies.  MEDICATIONS:  Current Outpatient Medications  Medication Sig Dispense Refill   acetaminophen  (TYLENOL ) 325 MG tablet Take 2 tablets (650 mg total) by mouth every 6 (six) hours as needed for headache. 30 tablet 0   fluticasone furoate-vilanterol (BREO ELLIPTA) 100-25 MCG/ACT AEPB Inhale 1 puff into the lungs daily as needed (shortness of breath).     amLODipine  (NORVASC ) 5 MG tablet Take 1 tablet (5 mg total) by mouth daily. For blood pressure 90 tablet 3   atorvastatin  (LIPITOR) 40 MG tablet Take 40 mg by mouth at bedtime.     carvedilol (COREG) 3.125 MG tablet Take 3.125 mg by mouth 2 (two) times daily.     fenofibrate micronized (LOFIBRA) 134 MG capsule Take 134 mg by mouth daily.  losartan (COZAAR) 100 MG tablet Take 100 mg by mouth daily.     pravastatin (PRAVACHOL) 10 MG tablet Take 10 mg by mouth daily.     No current facility-administered medications for this visit.    REVIEW OF SYSTEMS:    Review of Systems - Oncology  All other pertinent systems were reviewed and were negative except as mentioned above.  PHYSICAL EXAMINATION:     Vitals:   09/17/24 1056 09/17/24 1100  BP: 115/80   Pulse: (!) 102 96  Resp: 18   Temp: 97.8 F (36.6 C)   SpO2: 97%    Filed Weights   09/17/24 1056  Weight: 205 lb 14.4 oz (93.4 kg)    Physical Exam Constitutional:      General: She is not in acute distress.    Appearance: Normal appearance.  HENT:     Head: Normocephalic and atraumatic.  Cardiovascular:     Rate and Rhythm: Normal rate.  Pulmonary:     Effort: Pulmonary effort is normal. No respiratory distress.  Abdominal:     General: There is no distension.  Neurological:     General: No  focal deficit present.     Mental Status: She is alert and oriented to person, place, and time.  Psychiatric:        Mood and Affect: Mood normal.        Behavior: Behavior normal.      LABORATORY DATA:   I have reviewed the data as listed.  Results for orders placed or performed in visit on 09/17/24  CBC with Differential (Cancer Center Only)  Result Value Ref Range   WBC Count 9.0 4.0 - 10.5 K/uL   RBC 4.50 3.87 - 5.11 MIL/uL   Hemoglobin 12.6 12.0 - 15.0 g/dL   HCT 60.4 63.9 - 53.9 %   MCV 87.8 80.0 - 100.0 fL   MCH 28.0 26.0 - 34.0 pg   MCHC 31.9 30.0 - 36.0 g/dL   RDW 85.5 88.4 - 84.4 %   Platelet Count 237 150 - 400 K/uL   nRBC 0.0 0.0 - 0.2 %   Neutrophils Relative % 64 %   Neutro Abs 5.8 1.7 - 7.7 K/uL   Lymphocytes Relative 25 %   Lymphs Abs 2.2 0.7 - 4.0 K/uL   Monocytes Relative 8 %   Monocytes Absolute 0.7 0.1 - 1.0 K/uL   Eosinophils Relative 2 %   Eosinophils Absolute 0.2 0.0 - 0.5 K/uL   Basophils Relative 1 %   Basophils Absolute 0.1 0.0 - 0.1 K/uL   Immature Granulocytes 0 %   Abs Immature Granulocytes 0.03 0.00 - 0.07 K/uL  CMP (Cancer Center only)  Result Value Ref Range   Sodium 142 135 - 145 mmol/L   Potassium 3.7 3.5 - 5.1 mmol/L   Chloride 103 98 - 111 mmol/L   CO2 28 22 - 32 mmol/L   Glucose, Bld 89 70 - 99 mg/dL   BUN 18 8 - 23 mg/dL   Creatinine 9.13 9.55 - 1.00 mg/dL   Calcium  10.0 8.9 - 10.3 mg/dL   Total Protein 7.6 6.5 - 8.1 g/dL   Albumin 4.4 3.5 - 5.0 g/dL   AST 20 15 - 41 U/L   ALT 30 0 - 44 U/L   Alkaline Phosphatase 116 38 - 126 U/L   Total Bilirubin 0.6 0.0 - 1.2 mg/dL   GFR, Estimated >39 >39 mL/min   Anion gap 11 5 - 15  Lactate dehydrogenase  Result  Value Ref Range   LDH 178 105 - 235 U/L  Haptoglobin  Result Value Ref Range   Haptoglobin 170 37 - 355 mg/dL  TSH  Result Value Ref Range   TSH 3.700 0.350 - 4.500 uIU/mL  Iron and Iron Binding Capacity ([facility] only)  Result Value Ref Range   Iron 76 28 - 170  ug/dL   TIBC 585 749 - 549 ug/dL   Saturation Ratios 18 10.4 - 31.8 %   UIBC 338 ug/dL  Ferritin  Result Value Ref Range   Ferritin 59 11 - 307 ng/mL  Hgb Fractionation Cascade  Result Value Ref Range   Hgb F 0.0 0.0 - 2.0 %   Hgb A 97.6 96.4 - 98.8 %   Hgb A2 2.4 1.8 - 3.2 %   Hgb S 0.0 0.0 %   Interpretation, Hgb Fract Comment   Folate  Result Value Ref Range   Folate 6.3 >5.9 ng/mL  Vitamin B12  Result Value Ref Range   Vitamin B-12 807 180 - 914 pg/mL  Direct antiglobulin test (not at [facility])  Result Value Ref Range   DAT, complement NEG    DAT, IgG      NEG Performed at [facility], [address], [place], [place] [address]       RADIOGRAPHIC STUDIES:  No recent pertinent imaging studies available to review.  Orders Placed This Encounter  Procedures   CBC with Differential (Cancer Center Only)    Standing Status:   Future    Number of Occurrences:   1    Expiration Date:   09/17/2025   CMP (Cancer Center only)    Standing Status:   Future    Number of Occurrences:   1    Expiration Date:   09/17/2025   Lactate dehydrogenase    Standing Status:   Future    Number of Occurrences:   1    Expiration Date:   09/17/2025   Haptoglobin    Standing Status:   Future    Number of Occurrences:   1    Expiration Date:   09/17/2025   TSH    Standing Status:   Future    Number of Occurrences:   1    Expiration Date:   09/17/2025   Iron and Iron Binding Capacity ([facility] only)    Standing Status:   Future    Number of Occurrences:   1    Expiration Date:   09/17/2025   Ferritin    Standing Status:   Future    Number of Occurrences:   1    Expiration Date:   09/17/2025   Hgb Fractionation Cascade    Standing Status:   Future    Number of Occurrences:   1    Expiration Date:   09/17/2025   Folate    Standing Status:   Future    Number of Occurrences:   1    Expiration Date:   09/17/2025   Vitamin B12    Standing Status:   Future     Number of Occurrences:   1    Expiration Date:   09/17/2025   Direct antiglobulin test (not at [facility])    Standing Status:   Future    Number of Occurrences:   1    Expiration Date:   09/17/2025    Future Appointments  Date Time Provider Department Center  12/17/2024  9:00 AM [facility] None  12/17/2024  9:30 AM Jackline Castilla, MD [facility] None     I spent a total of 55 minutes during this encounter with the patient including review of chart and various tests results, discussions about plan of care and coordination of care plan.  This document was completed utilizing speech recognition software. Grammatical errors, random word insertions, pronoun errors, and incomplete sentences are an occasional consequence of this system due to software limitations, ambient noise, and hardware issues. Any formal questions or concerns about the content, text or information contained within the body of this dictation should be directly addressed to the provider for clarification.

## 2024-09-18 LAB — HGB FRACTIONATION CASCADE
Hgb A2: 2.4 % (ref 1.8–3.2)
Hgb A: 97.6 % (ref 96.4–98.8)
Hgb F: 0 % (ref 0.0–2.0)
Hgb S: 0 %

## 2024-09-19 LAB — HAPTOGLOBIN: Haptoglobin: 170 mg/dL (ref 37–355)

## 2024-09-23 ENCOUNTER — Encounter: Payer: Self-pay | Admitting: Oncology

## 2024-09-23 DIAGNOSIS — D649 Anemia, unspecified: Secondary | ICD-10-CM | POA: Insufficient documentation

## 2024-09-23 NOTE — Assessment & Plan Note (Signed)
 On 06/09/2024, labs showed hemoglobin of 10.4, hematocrit 35.3, MCV 98.  White count 9100 with normal differential, platelet count 230,000.  Creatinine 0.9, unremarkable CMP.  Previously folate was decreased at 5.16 in May 2025.  B12 was normal at 496.  Methylmalonic acid was also normal at 146.  Hemoglobin was 11.6 at that time with normal MCV of 99.  She was referred to us  for further evaluation of normocytic anemia.  No significant symptoms such as chest pain or dyspnea at rest, but experiences dyspnea with exertion. No evidence of blood loss. Previous labs indicated low folate levels. No current multivitamin supplementation. Plan to address folate deficiency with supplementation.  - Prescribed folic acid  supplement, 1 mg once daily.  Labs today actually showed normal hemoglobin of 12.6, MCV normal at 87.8.  White count and platelet count are within normal limits.  CMP grossly unremarkable.  Iron studies currently show no evidence of iron deficiency.  B12 and folate are also within normal limits.  Haptoglobin and Coombs test pending.  - Scheduled follow-up appointment in three months with repeat labs.  If stable blood counts, she can be discharged from our office after next visit.

## 2024-12-17 ENCOUNTER — Inpatient Hospital Stay: Admitting: Oncology

## 2024-12-17 ENCOUNTER — Inpatient Hospital Stay: Attending: Oncology
